# Patient Record
Sex: Female | Born: 1945 | Race: White | Hispanic: No | Marital: Married | Smoking: Former smoker
Health system: Southern US, Community
[De-identification: ages and names within clinical notes are randomized; demographics above are authoritative.]

## PROBLEM LIST (undated history)

## (undated) DIAGNOSIS — Z91038 Other insect allergy status: Secondary | ICD-10-CM

## (undated) DIAGNOSIS — J329 Chronic sinusitis, unspecified: Secondary | ICD-10-CM

## (undated) DIAGNOSIS — I10 Essential (primary) hypertension: Secondary | ICD-10-CM

## (undated) DIAGNOSIS — Z91030 Bee allergy status: Secondary | ICD-10-CM

## (undated) HISTORY — DX: Other insect allergy status: Z91.038

## (undated) HISTORY — PX: POLYPECTOMY: SHX149

## (undated) HISTORY — DX: Bee allergy status: Z91.030

## (undated) HISTORY — PX: COLONOSCOPY: SHX174

## (undated) HISTORY — DX: Essential (primary) hypertension: I10

## (undated) HISTORY — DX: Chronic sinusitis, unspecified: J32.9

---

## 2004-08-04 ENCOUNTER — Ambulatory Visit: Payer: Self-pay | Admitting: Internal Medicine

## 2004-08-26 ENCOUNTER — Ambulatory Visit: Payer: Self-pay | Admitting: Internal Medicine

## 2004-09-15 ENCOUNTER — Ambulatory Visit: Payer: Self-pay | Admitting: Internal Medicine

## 2005-03-08 ENCOUNTER — Ambulatory Visit: Payer: Self-pay | Admitting: Internal Medicine

## 2005-04-06 ENCOUNTER — Ambulatory Visit: Payer: Self-pay | Admitting: Internal Medicine

## 2005-08-02 ENCOUNTER — Ambulatory Visit: Payer: Self-pay | Admitting: Internal Medicine

## 2005-08-03 ENCOUNTER — Ambulatory Visit: Payer: Self-pay | Admitting: Internal Medicine

## 2005-11-01 ENCOUNTER — Ambulatory Visit: Payer: Self-pay | Admitting: Internal Medicine

## 2006-02-17 ENCOUNTER — Ambulatory Visit: Payer: Self-pay | Admitting: Internal Medicine

## 2006-02-21 ENCOUNTER — Ambulatory Visit: Payer: Self-pay | Admitting: Internal Medicine

## 2006-05-02 ENCOUNTER — Ambulatory Visit: Payer: Self-pay | Admitting: Internal Medicine

## 2006-07-24 ENCOUNTER — Ambulatory Visit: Payer: Self-pay | Admitting: Internal Medicine

## 2006-10-31 ENCOUNTER — Ambulatory Visit: Payer: Self-pay | Admitting: Internal Medicine

## 2006-12-15 ENCOUNTER — Ambulatory Visit: Payer: Self-pay | Admitting: Internal Medicine

## 2007-04-28 DIAGNOSIS — J329 Chronic sinusitis, unspecified: Secondary | ICD-10-CM | POA: Insufficient documentation

## 2007-04-28 DIAGNOSIS — J302 Other seasonal allergic rhinitis: Secondary | ICD-10-CM

## 2007-04-28 DIAGNOSIS — J3089 Other allergic rhinitis: Secondary | ICD-10-CM

## 2007-05-01 ENCOUNTER — Ambulatory Visit: Payer: Self-pay | Admitting: Internal Medicine

## 2007-05-02 ENCOUNTER — Ambulatory Visit: Payer: Self-pay | Admitting: Internal Medicine

## 2007-10-17 ENCOUNTER — Encounter: Payer: Self-pay | Admitting: Internal Medicine

## 2007-10-23 ENCOUNTER — Ambulatory Visit: Payer: Self-pay | Admitting: Internal Medicine

## 2007-10-24 ENCOUNTER — Ambulatory Visit: Payer: Self-pay | Admitting: Internal Medicine

## 2007-12-07 ENCOUNTER — Encounter: Payer: Self-pay | Admitting: Internal Medicine

## 2008-02-28 ENCOUNTER — Ambulatory Visit: Payer: Self-pay | Admitting: Internal Medicine

## 2008-04-08 ENCOUNTER — Ambulatory Visit: Payer: Self-pay | Admitting: Internal Medicine

## 2008-04-24 ENCOUNTER — Ambulatory Visit: Payer: Self-pay | Admitting: Internal Medicine

## 2008-05-05 ENCOUNTER — Ambulatory Visit: Payer: Self-pay | Admitting: Cardiology

## 2008-05-22 ENCOUNTER — Ambulatory Visit: Payer: Self-pay | Admitting: Internal Medicine

## 2008-07-21 ENCOUNTER — Ambulatory Visit: Payer: Self-pay | Admitting: Internal Medicine

## 2008-09-19 ENCOUNTER — Ambulatory Visit: Payer: Self-pay | Admitting: Internal Medicine

## 2008-12-01 ENCOUNTER — Ambulatory Visit: Payer: Self-pay | Admitting: Internal Medicine

## 2009-02-03 ENCOUNTER — Telehealth (INDEPENDENT_AMBULATORY_CARE_PROVIDER_SITE_OTHER): Payer: Self-pay | Admitting: *Deleted

## 2009-03-20 ENCOUNTER — Ambulatory Visit: Payer: Self-pay | Admitting: Internal Medicine

## 2009-04-08 ENCOUNTER — Ambulatory Visit: Payer: Self-pay | Admitting: Internal Medicine

## 2009-09-17 ENCOUNTER — Ambulatory Visit: Payer: Self-pay | Admitting: Internal Medicine

## 2009-10-09 ENCOUNTER — Ambulatory Visit: Payer: Self-pay | Admitting: Internal Medicine

## 2010-02-12 ENCOUNTER — Telehealth (INDEPENDENT_AMBULATORY_CARE_PROVIDER_SITE_OTHER): Payer: Self-pay | Admitting: *Deleted

## 2010-03-16 ENCOUNTER — Ambulatory Visit: Payer: Self-pay | Admitting: Internal Medicine

## 2010-03-23 ENCOUNTER — Ambulatory Visit: Payer: Self-pay | Admitting: Internal Medicine

## 2010-05-10 ENCOUNTER — Ambulatory Visit: Payer: Self-pay | Admitting: Internal Medicine

## 2010-08-01 IMAGING — CT CT PARANASAL SINUSES LIMITED
2 of 3 series · 16 of 30 positions shown, 20 images · non-contrast
Comparison: None

CLINICAL DATA: Facial pressure behind eyes.  Congestion.  Cough.

CT PARANASAL SINUS LIMITED WITHOUT CONTRAST

[Series 4: ltd sinus 3.0 h30s · axial · 0.22mm/px · z∈[-110,-82]mm · 3 of 30 slices shown]
[im 3/30  bone]
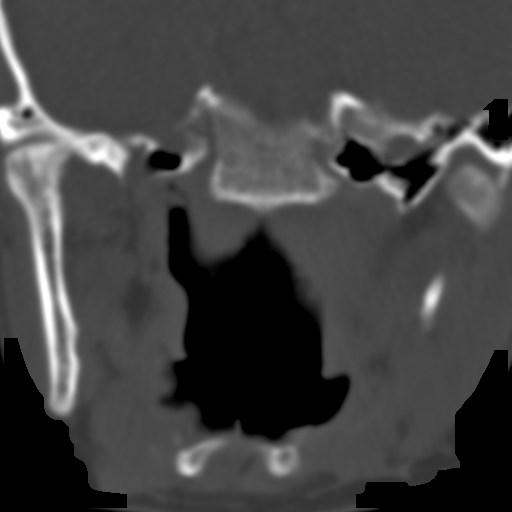
[im 7/30  bone]
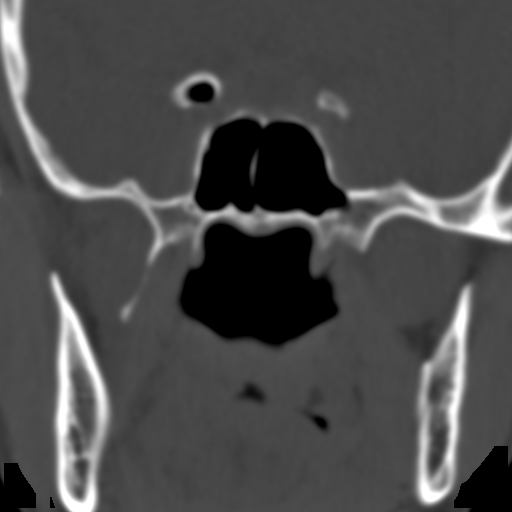
[im 9/30  bone]
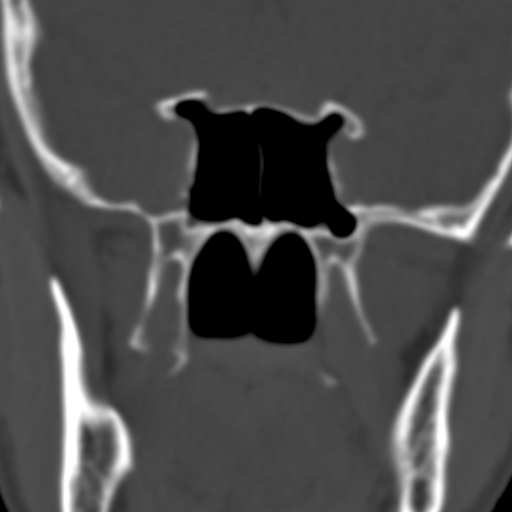

[Series 5: ltd sinus 3.0 h60s · axial · 0.29mm/px · z∈[-87,+25]mm · 13 of 30 slices shown, 17 images]
[im 3/30  brain]
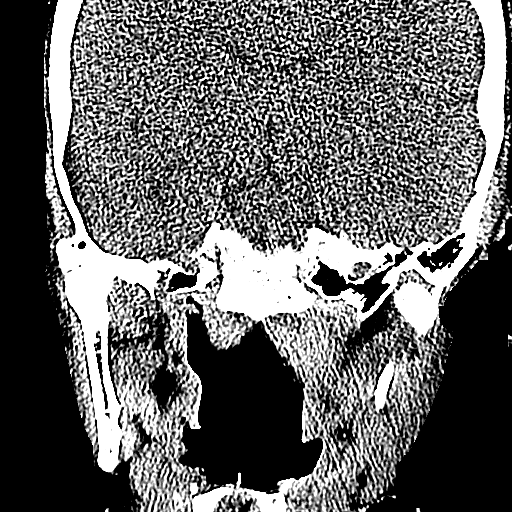
[im 3/30  bone]
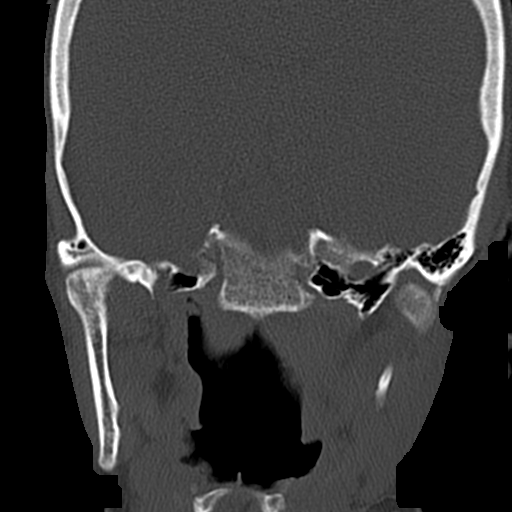
[im 5/30  bone]
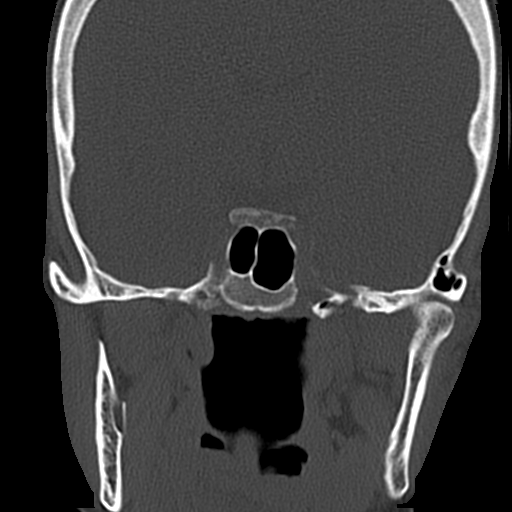
[im 7/30  bone]
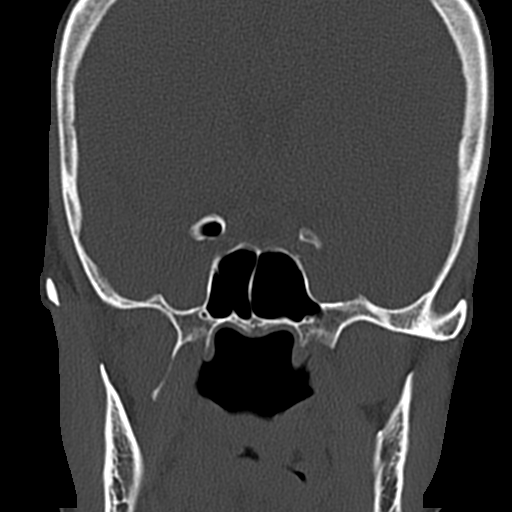
[im 9/30  bone]
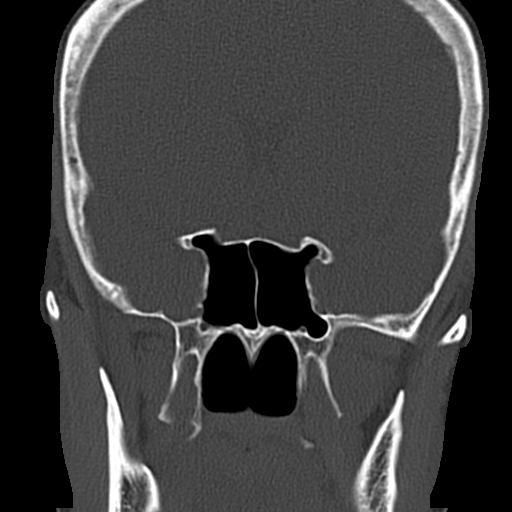
[im 11/30  brain]
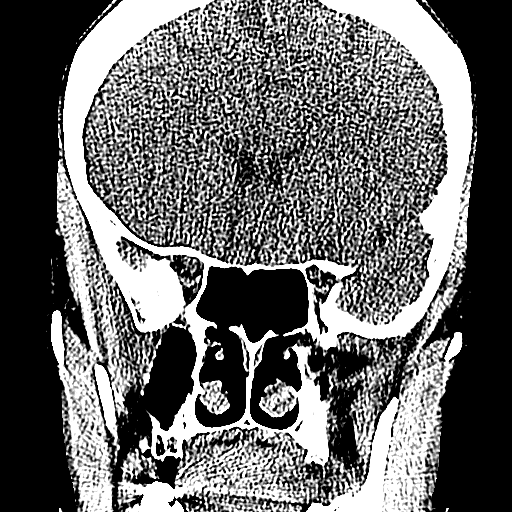
[im 11/30  bone]
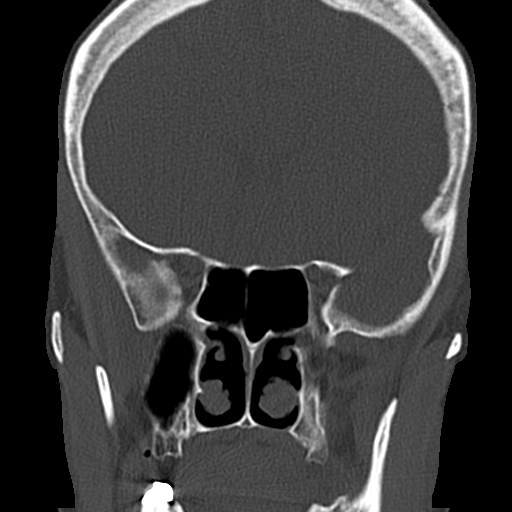
[im 13/30  bone]
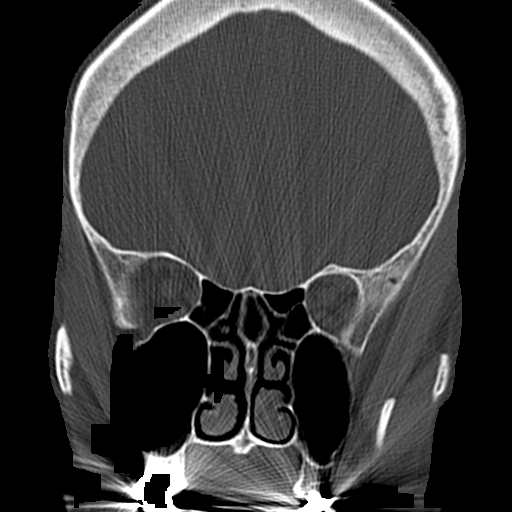
[im 15/30  bone]
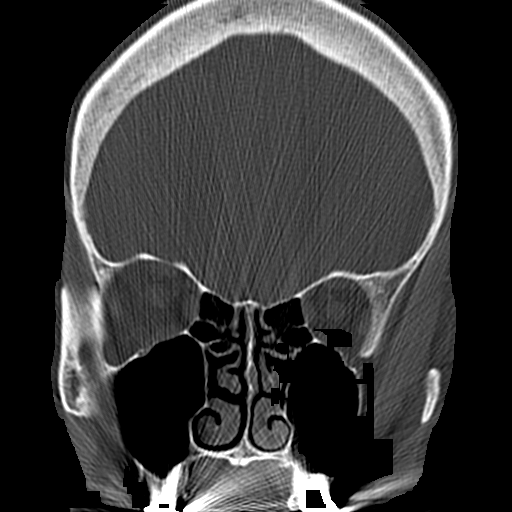
[im 17/30  bone]
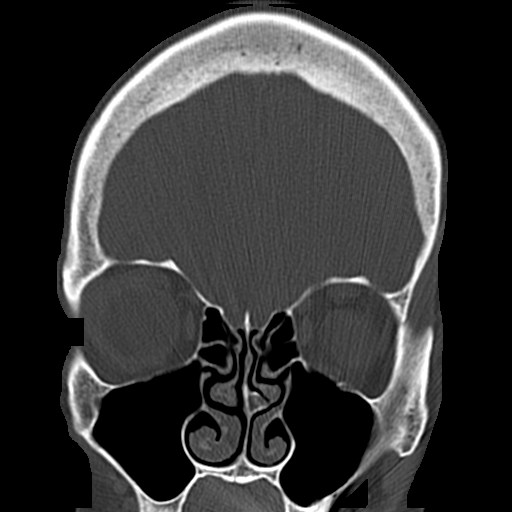
[im 19/30  brain]
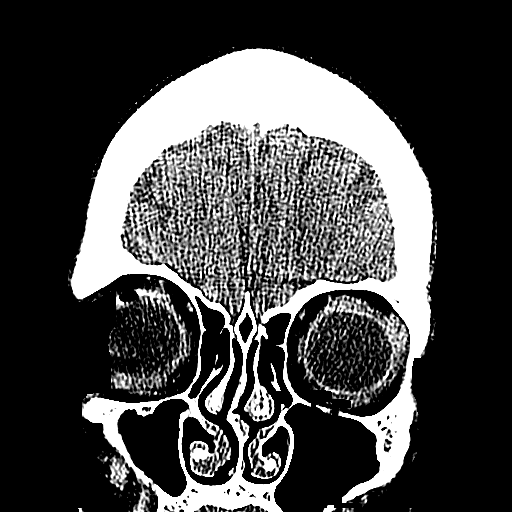
[im 19/30  bone]
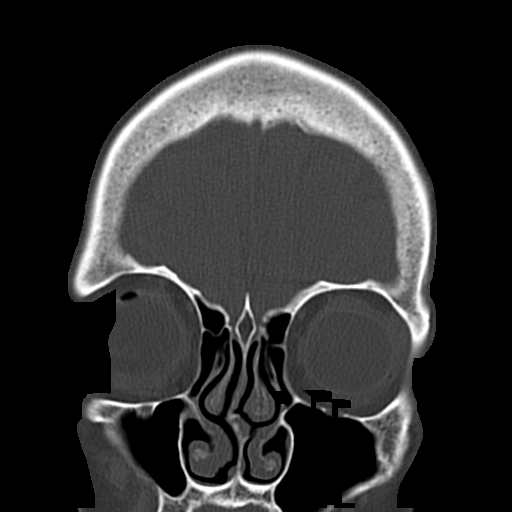
[im 21/30  bone]
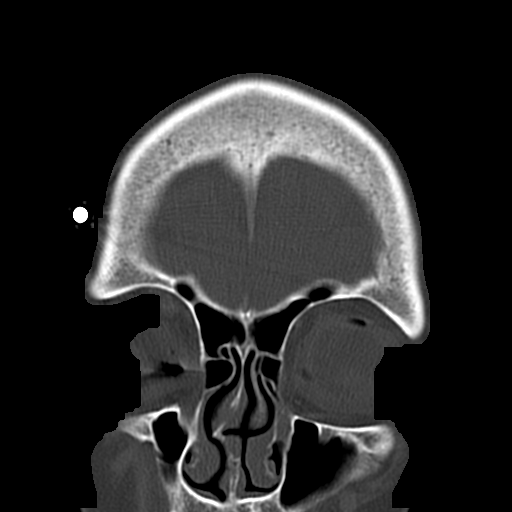
[im 23/30  bone]
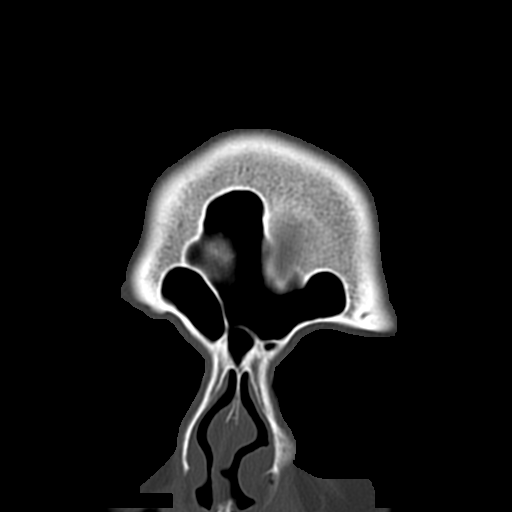
[im 25/30  bone]
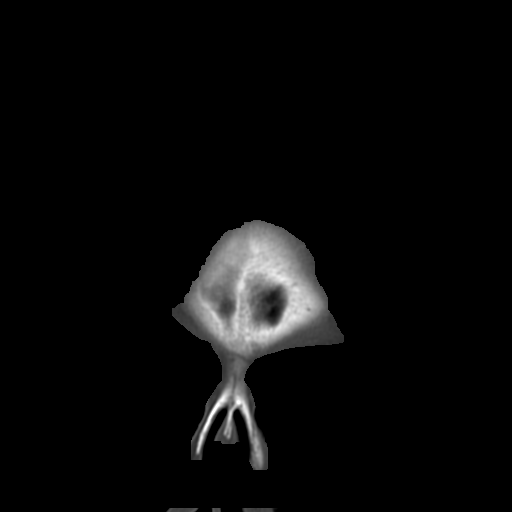
[im 27/30  brain]
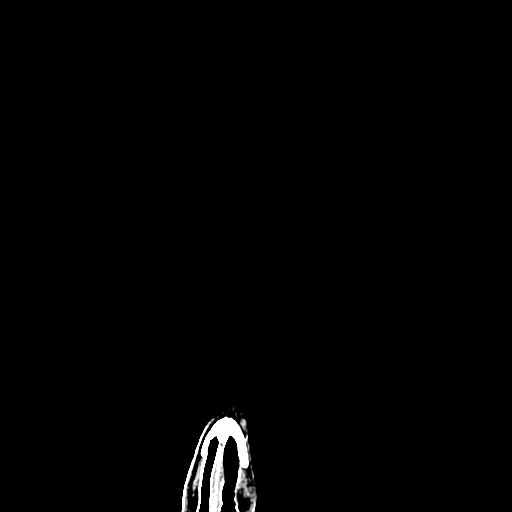
[im 27/30  bone]
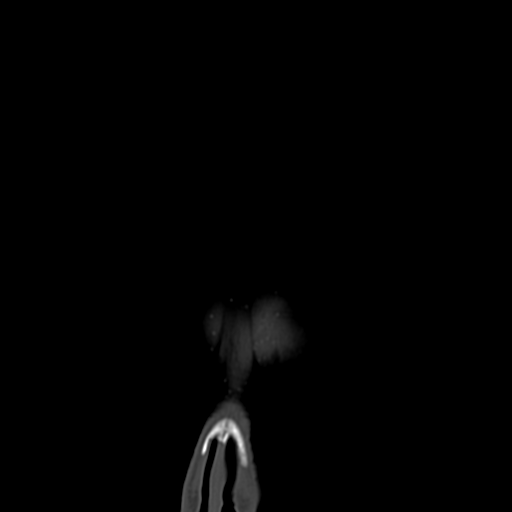

[16 of 30 positions shown; findings below may reference images not displayed]

FINDINGS: Visualized paranasal sinuses are clear.  Rightward nasal
septum deviation.  Visualized intracranial contents show no acute
findings.
IMPRESSION: Clear paranasal sinuses.

## 2010-08-12 NOTE — Assessment & Plan Note (Signed)
Summary: flu shot/ mbw  Nurse Visit   Allergies: No Known Drug Allergies  Orders Added: 1)  Admin 1st Vaccine [90471] 2)  Flu Vaccine 37yrs + [16109] Flu Vaccine Consent Questions     Do you have a history of severe allergic reactions to this vaccine? no    Any prior history of allergic reactions to egg and/or gelatin? no    Do you have a sensitivity to the preservative Thimersol? no    Do you have a past history of Guillan-Barre Syndrome? no    Do you currently have an acute febrile illness? no    Have you ever had a severe reaction to latex? no    Vaccine information given and explained to patient? yes    Are you currently pregnant? no    Lot Number:[id number]   Exp Date:01/08/2011   Site Given  Left Deltoid IM .lbflu1   Tammy Scott  May 13, 2010 4:26 PM

## 2010-08-12 NOTE — Assessment & Plan Note (Signed)
Summary: 6mos f/u...Marland KitchenMarland Kitchenea   Primary Provider/Referring Provider:  Susann Givens  CC:  6 month follow up visit-allergies..  History of Present Illness:  9.10/10 Rhinitis, rhiniosinusits Doing fairly well with little acute illness. Two weeks ago had stomach upset ending with some some pressure in the sinuses. She continued routine meds and took her vitamins- cleared spontaneoulsy. Does well with allergy shots, givingt her own. Takes one or sometimes two of the Allegra-D each day. allergy vaccine saftey and care talk.   September 17, 2009- Rhinitis, rhinosinusiits No acute resiratory illness throughthe winter. Now after putting out mulch she has had nasal congestion, blowing clear mucus, retroorbital pressure. Denies purulent or bloody discharge, earache, fever, chills, cough, wheeze, nausea or heartburn. We discussed her allergy vaccine program which she thinks is working for her. Needs Epipen refill as discussed.  March 16, 2010- Rhinitis, sinusitis Doing well this Fall. Needs Epipen reill. No significant issues with transition to cooler weather. A little cough occasionally- not bothersome.    Preventive Screening-Counseling & Management  Alcohol-Tobacco     Smoking Status: quit     Year Started: 1961     Year Quit: 1976     Pack years: 1PPD X 15 YEARS  Current Medications (verified): 1)  Allergy Injections 1:10 Go W-E .... Per Schedule 2)  Fexofenadine-Pseudoephedrine 60-120 Mg Xr12h-Tab (Fexofenadine-Pseudoephedrine) .Marland Kitchen.. 1 Up To Twice Daily Allergy As Needed 3)  Epipen 0.3 Mg/0.42ml (1:1000)  Devi (Epinephrine Hcl (Anaphylaxis)) .... For Severe Allergic Reaction 4)  Fluticasone Propionate 50 Mcg/act Susp (Fluticasone Propionate) .Marland Kitchen.. 1-2 Sprays Each Nostril Daily  Allergies (verified): No Known Drug Allergies  Past History:  Past Medical History: Last updated: 10/23/2007  SINUSITIS, RECURRENT (ICD-473.9) ALLERGIC RHINITIS (ICD-477.9)  Past Surgical History: Last updated:  09/19/2008 None  Family History: Last updated: 10/23/2007 Father -lung cacer Mother- HTN,CVA  Social History: Last updated: 10/23/2007 Patient states former smoker.   Risk Factors: Smoking Status: quit (03/16/2010)  Social History: Pack years:  1PPD X 15 YEARS  Review of Systems      See HPI       The patient complains of nasal congestion/difficulty breathing through nose.  The patient denies shortness of breath with activity, shortness of breath at rest, productive cough, non-productive cough, coughing up blood, chest pain, irregular heartbeats, acid heartburn, indigestion, loss of appetite, weight change, abdominal pain, difficulty swallowing, sore throat, tooth/dental problems, headaches, and sneezing.    Vital Signs:  Patient profile:   65 year old female Height:      63 inches Weight:      137.13 pounds BMI:     24.38 O2 Sat:      100 % on Room air Pulse rate:   88 / minute BP sitting:   122 / 70  (left arm) Cuff size:   regular  Vitals Entered By: Reynaldo Minium CMA (March 16, 2010 2:27 PM)  O2 Flow:  Room air CC: 6 month follow up visit-allergies.   Physical Exam  Additional Exam:  General: A/Ox3; pleasant and cooperative, NAD, SKIN: no rash, lesions NODES: no lymphadenopathy HEENT: [place]/AT, EOM- WNL, Conjuctivae- clear, PERRLA, TM-WNL, Nose- turbinate edema, some mucus,narrow nose, mild septal deviation to left  Throat- clear and wnl, Malampatti III NECK: Supple w/ fair ROM, JVD- none, normal carotid impulses w/o bruits Thyroid-  CHEST: Clear to P&A HEART: RRR, no m/g/r heard ABDOMEN: FAO:ZHYQ, nl pulses, no edema  NEURO: Grossly intact to observation      Impression & Recommendations:  Problem #  1:  ALLERGIC RHINITIS (ICD-477.9)  She continues alllergy vaccine without concerns. Needs updated Epipen Rx but never needed to use it.  We discussed antihistamine/ decongestants. She asks for alternative to [facility] which she takes every day. Continues  fluticasone. Her updated medication list for this problem includes:    Fluticasone Propionate 50 Mcg/act Susp (Fluticasone propionate) .Marland Kitchen... 1-2 sprays each nostril daily  Problem # 2:  SINUSITIS, RECURRENT (ICD-473.9) We compared symptoms and explained differences. i don't think she has an active sinusitis now.  Medications Added to Medication List This Visit: 1)  Clarinex-d 12 Hour 2.5-120 Mg Xr12h-tab (Desloratadine-pseudoephedrine) .Marland Kitchen.. 1 two times a day as needed  Other Orders: Est. Patient Level III (45409) Est. Patient Level III (81191)  Patient Instructions: 1)  Please schedule a follow-up appointment in 6 months. 2)  continue allergy vaccine 3)  Script refill Epipen sent to your drug store 4)  Script for Clarinex-D to replace Allegra-D Prescriptions: FLUTICASONE PROPIONATE 50 MCG/ACT SUSP (FLUTICASONE PROPIONATE) 1-2 sprays each nostril daily  #3 x 3   Entered by:   Reynaldo Minium CMA   Authorized by:   Waymon Budge MD   Signed by:   Reynaldo Minium CMA on 03/16/2010   Method used:   Print then Give to Patient   RxID:   [id number] EPIPEN 0.3 MG/0.3ML (1:1000)  DEVI (EPINEPHRINE HCL (ANAPHYLAXIS)) For severe allergic reaction  #1 x prn   Entered and Authorized by:   Waymon Budge MD   Signed by:   Waymon Budge MD on 03/16/2010   Method used:   Electronically to        [facility] [phone]* (retail)       [address]       [place], [place]  [address]       Ph: [phone] or [phone]       Fax: [phone]   RxID:   [id number] CLARINEX-D 12 HOUR 2.5-120 MG XR12H-TAB (DESLORATADINE-PSEUDOEPHEDRINE) 1 two times a day as needed  #180 x 3   Entered and Authorized by:   Waymon Budge MD   Signed by:   Waymon Budge MD on 03/16/2010   Method used:   Print then Give to Patient   RxID:   [id number] EPIPEN 0.3 MG/0.3ML (1:1000)  DEVI (EPINEPHRINE HCL (ANAPHYLAXIS)) For severe allergic reaction  #1 x prn   Entered  and Authorized by:   Waymon Budge MD   Signed by:   Waymon Budge MD on 03/16/2010   Method used:   Print then Give to Patient   RxID:   [id number]

## 2010-08-12 NOTE — Progress Notes (Signed)
Summary: rx  Phone Note Call from Patient Call back at Home Phone [phone]   Caller: Patient Call For: young Reason for Call: Refill Medication, Talk to Nurse Summary of Call: generic for Allegra D.  Wants rx sent [facility] Initial call taken by: Eugene Gavia,  February 12, 2010 10:05 AM  Follow-up for Phone Call        Rx was sent by fax to [facility]  Pt aware. Follow-up by: Vernie Murders,  February 12, 2010 10:34 AM    Prescriptions: FEXOFENADINE-PSEUDOEPHEDRINE 60-120 MG XR12H-TAB (FEXOFENADINE-PSEUDOEPHEDRINE) 1 up to twice daily allergy as needed  #180 x 3   Entered by:   Vernie Murders   Authorized by:   Waymon Budge MD   Signed by:   Vernie Murders on 02/12/2010   Method used:   Faxed to ...       [facility] (mail[facility]order)       [address] [place], [place]  [address]       Ph: [phone]       Fax: [phone]   RxID:   [id number]

## 2010-08-12 NOTE — Assessment & Plan Note (Signed)
Summary: rov 6 months///kp   Primary Provider/Referring Provider:  Susann Givens  CC:  6 month follow up visit-needs new RX for epipen.Marland Kitchen  History of Present Illness:  05/22/08- Stable, denies improvement. Head congestion, dry cough, retoorbital pressure headache/ Denies fever, purulent, blood, Continues vaccine at 1:10.aars ok. Has 2 dogs, wood flooring. Quit Nasa- cort and singulair wi/ out help.  09/19/08- Rhinitis, rhinosinusitis Did well after cefdinir, until she caught a persistent cold from her granddaughter lasting about a month- no treatment taken. No longer blowing from head and chest feels well. May be starting to get a little more stuffy as Spring comes in. Continues allergy vaccine.  9.10/10 Rhinitis, rhiniosinusits Doing fairly well with little acute illness. Two weeks ago had stomach upset ending with some some pressure in the sinuses. She continued routine meds and took her vitamins- cleared spontaneoulsy. Does well with allergy shots, givingt her own. Takes one or sometimes two of the Allegra-D each day. allergy vaccine saftey and care talk.   September 17, 2009- Rhinitis, rhinosinusiits No acute resiratory illness throughthe winter. Now after putting out mulch she has had nasal congestion, blowing clear mucus, retroorbital pressure. Denies purulent or bloody discharge, earache, fever, chills, cough, wheeze, nausea or heartburn. We discussed her allergy vaccine program which she thinks is working for her. Needs Epipen refill as discussed.   Current Medications (verified): 1)  Allergy Injections 1:10 Go W-E .... Per Schedule 2)  Fexofenadine-Pseudoephedrine 60-120 Mg Xr12h-Tab (Fexofenadine-Pseudoephedrine) .Marland Kitchen.. 1 Up To Twice Daily Allergy As Needed 3)  Epipen 0.3 Mg/0.26ml (1:1000)  Devi (Epinephrine Hcl (Anaphylaxis)) .... For Sevfere Allergic Reaction 4)  Fluticasone Propionate 50 Mcg/act Susp (Fluticasone Propionate) .Marland Kitchen.. 1-2 Sprays Each Nostril Daily  Allergies (verified): No  Known Drug Allergies  Past History:  Past Medical History: Last updated: 10/23/2007  SINUSITIS, RECURRENT (ICD-473.9) ALLERGIC RHINITIS (ICD-477.9)  Past Surgical History: Last updated: 09/19/2008 None  Family History: Last updated: 10/23/2007 Father -lung cacer Mother- HTN,CVA  Social History: Last updated: 10/23/2007 Patient states former smoker.   Risk Factors: Smoking Status: quit (10/23/2007)  Review of Systems      See HPI  The patient denies anorexia, fever, weight loss, weight gain, vision loss, decreased hearing, hoarseness, chest pain, syncope, dyspnea on exertion, peripheral edema, prolonged cough, headaches, hemoptysis, abdominal pain, and severe indigestion/heartburn.    Vital Signs:  Patient profile:   65 year old female Height:      63 inches Weight:      142.38 pounds BMI:     25.31 O2 Sat:      98 % on Room air Pulse rate:   93 / minute BP sitting:   124 / 82  (left arm) Cuff size:   regular  Vitals Entered By: Reynaldo Minium CMA (September 17, 2009 1:54 PM)  O2 Flow:  Room air  Physical Exam  Additional Exam:  General: A/Ox3; pleasant and cooperative, NAD, SKIN: no rash, lesions NODES: no lymphadenopathy HEENT: [place]/AT, EOM- WNL, Conjuctivae- clear, PERRLA, TM-WNL, Nose- turbinate edema, some mucus,narrow nose Throat- clear and wnl, Melampatti III NECK: Supple w/ fair ROM, JVD- none, normal carotid impulses w/o bruits Thyroid-  CHEST: Clear to P&A HEART: RRR, no m/g/r heard ABDOMEN: ZOX:WRUE, nl pulses, no edema  NEURO: Grossly intact to observation      Impression & Recommendations:  Problem # 1:  ALLERGIC RHINITIS (ICD-477.9)  We think she can resolve this rhinitis without needikng antibiotics. She will continue allergy vaccine. The following medications were removed from the medication list:  Patanase 0.6 % Soln (Olopatadine hcl) .Marland Kitchen... 1-2 sprays each nostril up to twice daily prn Her updated medication list for this problem  includes:    Fluticasone Propionate 50 Mcg/act Susp (Fluticasone propionate) .Marland Kitchen... 1-2 sprays each nostril daily  Orders: Est. Patient Level III (40102)  Problem # 2:  SINUSITIS, RECURRENT (ICD-473.9) We discussed saline nasal rinse and preventative measures.  Medications Added to Medication List This Visit: 1)  Epipen 0.3 Mg/0.86ml (1:1000) Devi (Epinephrine hcl (anaphylaxis)) .... For severe allergic reaction  Patient Instructions: 1)  Please schedule a follow-up appointment in 6 months. 2)  Epipen refilled

## 2010-08-18 DIAGNOSIS — J301 Allergic rhinitis due to pollen: Secondary | ICD-10-CM

## 2010-09-14 ENCOUNTER — Ambulatory Visit (INDEPENDENT_AMBULATORY_CARE_PROVIDER_SITE_OTHER): Payer: PRIVATE HEALTH INSURANCE | Admitting: Internal Medicine

## 2010-09-14 ENCOUNTER — Encounter: Payer: Self-pay | Admitting: Internal Medicine

## 2010-09-14 DIAGNOSIS — J329 Chronic sinusitis, unspecified: Secondary | ICD-10-CM

## 2010-09-14 DIAGNOSIS — J309 Allergic rhinitis, unspecified: Secondary | ICD-10-CM

## 2010-09-21 NOTE — Assessment & Plan Note (Signed)
Summary: 6 month follow up   Primary Provider/Referring Provider:  Susann Givens  CC:  6 month follow up visit-allergies..  History of Present Illness: September 17, 2009- Rhinitis, rhinosinusiits No acute resiratory illness throughthe winter. Now after putting out mulch she has had nasal congestion, blowing clear mucus, retroorbital pressure. Denies purulent or bloody discharge, earache, fever, chills, cough, wheeze, nausea or heartburn. We discussed her allergy vaccine program which she thinks is working for her. Needs Epipen refill as discussed.  March 16, 2010- Rhinitis, sinusitis Doing well this Fall. Needs Epipen refill. No significant issues with transition to cooler weather. A little cough occasionally- not bothersome.  September 14, 2010- Rhinitis, sinusitis Nurse-CC: 6 month follow up visit-allergies. Allergy vac 1:10 GO. We reviewed her toolbox. She thinks these meds will be adequate. Has been using her fluticasone more regularly- 2 to 3x/ week in last 2 weeks as Spring pollens have climbed early.   Preventive Screening-Counseling & Management  Alcohol-Tobacco     Smoking Status: quit     Year Started: 1961     Year Quit: 1976     Pack years: 1PPD X 15 YEARS  Current Medications (verified): 1)  Allergy Injections 1:10 Go W-E .... Per Schedule 2)  Clarinex-D 12 Hour 2.5-120 Mg Xr12h-Tab (Desloratadine-Pseudoephedrine) .Marland Kitchen.. 1 Two Times A Day As Needed 3)  Epipen 0.3 Mg/0.69ml (1:1000)  Devi (Epinephrine Hcl (Anaphylaxis)) .... For Severe Allergic Reaction 4)  Fluticasone Propionate 50 Mcg/act Susp (Fluticasone Propionate) .Marland Kitchen.. 1-2 Sprays Each Nostril Daily  Allergies (verified): No Known Drug Allergies  Past History:  Past Medical History: Last updated: 10/23/2007  SINUSITIS, RECURRENT (ICD-473.9) ALLERGIC RHINITIS (ICD-477.9)  Past Surgical History: Last updated: 09/19/2008 None  Family History: Last updated: 10/23/2007 Father -lung cacer Mother- HTN,CVA  Social  History: Last updated: 10/23/2007 Patient states former smoker.   Risk Factors: Smoking Status: quit (09/14/2010)  Review of Systems      See HPI       The patient complains of nasal congestion/difficulty breathing through nose and sneezing.  The patient denies shortness of breath with activity, shortness of breath at rest, productive cough, non-productive cough, coughing up blood, chest pain, irregular heartbeats, acid heartburn, indigestion, loss of appetite, weight change, abdominal pain, difficulty swallowing, sore throat, tooth/dental problems, and headaches.    Vital Signs:  Patient profile:   65 year old female Height:      63 inches Weight:      143 pounds BMI:     25.42 O2 Sat:      98 % on Room air Pulse rate:   87 / minute BP sitting:   134 / 82  (left arm) Cuff size:   regular  Vitals Entered By: Reynaldo Minium CMA (September 14, 2010 1:39 PM)  O2 Flow:  Room air CC: 6 month follow up visit-allergies.   Physical Exam  Additional Exam:  General: A/Ox3; pleasant and cooperative, NAD, SKIN: no rash, lesions NODES: no lymphadenopathy HEENT: [place]/AT, EOM- WNL, Conjuctivae- clear, PERRLA, TM-WNL, Nose-  less turbinate edema, some mucus,narrow nose, mild septal deviation to left  Throat- clear and wnl, Malampatti III NECK: Supple w/ fair ROM, JVD- none, normal carotid impulses w/o bruits Thyroid-  CHEST: Clear to P&A HEART: RRR, no m/g/r heard ABDOMEN: ZOX:WRUE, nl pulses, no edema  NEURO: Grossly intact to observation      Impression & Recommendations:  Problem # 1:  ALLERGIC RHINITIS (ICD-477.9)  Good control. she will continue present meds. She will continue allergy  vaccine.  Her updated medication list for this problem includes:    Fluticasone Propionate 50 Mcg/act Susp (Fluticasone propionate) .Marland Kitchen... 1-2 sprays each nostril daily  Problem # 2:  SINUSITIS, RECURRENT (ICD-473.9) No recent sinusitis. On exam there is some septal deviation and we discused  indications for septoplasty. Appropriate to pass on this now.   Other Orders: Est. Patient Level III (81191)  Patient Instructions: 1)  Please schedule a follow-up appointment in 1 year. Please call sooner as needed.

## 2010-11-23 NOTE — Assessment & Plan Note (Signed)
[facility]                             PULMONARY OFFICE NOTE   NAME:Alisha, MARILYNE [name]                  MRN:          [id number]  DATE:05/01/2007                            DOB:          May 08, 1946    PULMONARY FOLLOWUP:   PROBLEM:  1. Allergic rhinitis.  2. Recurrent sinusitis.   HISTORY:  Six-month followup.  She had more trouble during the summer  and works outdoors.  In the last month, with cooler weather, she has  been much more comfortable.  She had switched from Allegra 180 to using  Clarinex D.  She and her husband are considering installing hardwood  floors in the home and we discussed this, along with a general  discussion of environmental precautions, air cleaners and casings and  realistic expectations.  She has been doing some saline lavage and, with  that, dropped off most of her other medicines, including Singulair and  Nasacort.  Nothing purulent.  No nose-bleeds.  No chest discomfort,  wheeze or cough.   MEDICATIONS:  1. Allergy vaccine.  2. Sinus Cleanse, which is a saline lavage.   ALLERGIES:  No medication allergy.   OBJECTIVE:  Weight 132 pounds, BP 104/66, pulse 91, room air saturation  99%.  Nasal mucosa looks normal today.  No significant pallor or edema.  No  postnasal drainage or adenopathy.  Breathing is unlabored and quiet.  Heart sounds normal.   IMPRESSION:  Allergic rhinitis.   PLAN:  1. She declines flu vaccine.  2. Environmental information given.  3. Try over-the-counter Nasalcrom spray per directions.  4. May try over-the-counter Claritin or Zyrtec.  5. May try using Singulair off and on two weeks at a time to decide if      it is helpful for her.  6. Schedule return six months, early p.r.n.     Clinton D. Maple Hudson, MD, [facility], FACP  Electronically Signed    CDY/MedQ  DD: 05/01/2007  DT: 05/02/2007  Job #: [id number]   cc:   Sharlot Gowda, M.D.

## 2010-11-26 NOTE — Assessment & Plan Note (Signed)
[facility]                             PULMONARY OFFICE NOTE   NAME:Bulnes, BRYNNLEE CUMPIAN                  MRN:          [id number]  DATE:10/31/2006                            DOB:          07-19-1945    PROBLEMS:  1. Allergic rhinitis.  2. Recurrent sinusitis.   HISTORY:  A 62-month-old followup.  She reports fair winter and spring  with no recent need of antibiotics.  She continues an allergy vaccine  here at 1:10 and thinks that helps her.  A little mucus discharge.  No  chest problems at all.  No headache or epistaxis.   MEDICATIONS:  1. Allegra 180 mg.  2. Allergy vaccine.  3. Nasacort-AQ.  4. Singulair.   No medication allergy.   OBJECTIVE:  VITAL SIGNS:  Weight 136 pounds.  BP 132/64, pulse regular  at 74.  Room air saturation 98%.  HEENT:  Nasal mucosa is pale and edematous.  There is a little mucus  bridging.  No polyps.  The pharynx looks clear.  Conjunctivae are clear.  LUNGS:  Clear.  HEART:  Heart sounds are normal.   IMPRESSION:  Perennial allergic rhinitis.   PLAN:  Samples of Clarinex-D 12 hour to try 1 b.i.d. p.r.n. with  discussion as an alternative to her Allegra 180 mg.  If that is helpful,  she can also try an over-the-counter antihistamine decongestant, as  discussed.  I also suggested she try Breathe Right strips.  She will  continue her vaccine and schedule her return in six months, earlier  p.r.n.     Clinton D. Maple Hudson, MD, [facility], FACP  Electronically Signed    CDY/MedQ  DD: 11/01/2006  DT: 11/02/2006  Job #: [id number]   cc:   Sharlot Gowda, M.D.

## 2010-11-26 NOTE — Assessment & Plan Note (Signed)
 [facility]                               PULMONARY OFFICE NOTE   NAME:[name] [name] CAUDLE                  MRN:          [id number]  DATE:05/02/2006                            DOB:          [birth year]    PROBLEM:  1. Allergic rhinitis.  2. Recurrent sinusitis.   HISTORY:  She says this has been a good summer, but now in the last week she  has had increased congestion in her nose and head. She may actually be  getting better now, but is not clear that this was a cold. She is not  itching or sneezing and there has been no purulent discharge.   MEDICATIONS:  1. She continues allergy vaccine here at 1-10 with no problems.  2. Allegra 180 mg.  3. Nasacort AQ.  4. Singulair.   OBJECTIVE:  Weight: 134 pounds.  Blood pressure: 122/66.  Pulse: Regular,  73.  Room air saturation: 98%.  There is turbinate edema.  Her conjunctivae are clear.  THROAT: Is clear.  LUNGS:  Are clear.  HEART: Heart sounds regular without murmur.   IMPRESSION:  Allergic rhinitis, possible acute viral syndrome.   PLAN:  1. Z-Pak, to hold with discussion.  2. Saline lavage.  3. Flu vaccine, discussed and given.  4. Supportive care and fluids.  5. Return six months, earlier p.r.n.     Clinton D. Maple Hudson, MD, [facility], FACP    CDY/MedQ  DD: 05/03/2006  DT: 05/04/2006  Job #: [id number]   cc:   [name] [name], M.D.

## 2010-12-17 ENCOUNTER — Telehealth: Payer: Self-pay | Admitting: Internal Medicine

## 2010-12-17 NOTE — Telephone Encounter (Signed)
CY, please advise. Thanks!  

## 2010-12-19 NOTE — Telephone Encounter (Signed)
OK to substitute Clarinex-D 24,   # 30, 1 daily if needed. Refill x 1.

## 2010-12-20 NOTE — Telephone Encounter (Signed)
Spoke with Alisha [name], a pharmacist at [facility] and advised okay per CDY for pt to take the 24 hr clarinex-d

## 2011-01-14 ENCOUNTER — Ambulatory Visit (INDEPENDENT_AMBULATORY_CARE_PROVIDER_SITE_OTHER): Payer: PRIVATE HEALTH INSURANCE

## 2011-01-14 DIAGNOSIS — J309 Allergic rhinitis, unspecified: Secondary | ICD-10-CM

## 2011-05-03 ENCOUNTER — Ambulatory Visit (INDEPENDENT_AMBULATORY_CARE_PROVIDER_SITE_OTHER): Payer: PRIVATE HEALTH INSURANCE

## 2011-05-03 DIAGNOSIS — Z23 Encounter for immunization: Secondary | ICD-10-CM

## 2011-05-04 DIAGNOSIS — Z23 Encounter for immunization: Secondary | ICD-10-CM

## 2011-06-20 ENCOUNTER — Telehealth: Payer: Self-pay | Admitting: Internal Medicine

## 2011-06-20 MED ORDER — AZITHROMYCIN 250 MG PO TABS: ORAL_TABLET | ORAL | Status: AC

## 2011-06-20 NOTE — Telephone Encounter (Signed)
Accidentally closed phone note. Pt has KNDA.

## 2011-06-20 NOTE — Telephone Encounter (Signed)
Per CY-try Zpak  #1 take as directed no refills;please update allergy list.

## 2011-06-20 NOTE — Telephone Encounter (Signed)
Addended by: Tommie Sams on: 06/20/2011 10:32 AM   Modules accepted: Orders

## 2011-06-20 NOTE — Telephone Encounter (Signed)
I spoke with pt and she c/o body aches, dry cough, nasal congestion, drainage down back of the throat x Thursday. Pt is not sure if she is running a fever but does not feel like it. Pt states her symptoms change from day to day. Pt has been doing saline spray and her allergy medications. Pt is requesting an apt to be seen today but no available apts with cdy. Please advise Dr. Maple Hudson, thanks  Allergies not on file

## 2011-06-20 NOTE — Telephone Encounter (Signed)
Pt aware of cdy recs and rx has been sent. Nothing further was needed

## 2011-06-22 ENCOUNTER — Ambulatory Visit (INDEPENDENT_AMBULATORY_CARE_PROVIDER_SITE_OTHER): Payer: PRIVATE HEALTH INSURANCE

## 2011-06-22 DIAGNOSIS — J309 Allergic rhinitis, unspecified: Secondary | ICD-10-CM

## 2011-09-09 ENCOUNTER — Encounter: Payer: Self-pay | Admitting: Internal Medicine

## 2011-09-12 ENCOUNTER — Ambulatory Visit (INDEPENDENT_AMBULATORY_CARE_PROVIDER_SITE_OTHER): Payer: PRIVATE HEALTH INSURANCE | Admitting: Internal Medicine

## 2011-09-12 ENCOUNTER — Encounter: Payer: Self-pay | Admitting: Internal Medicine

## 2011-09-12 VITALS — BP 112/66 | HR 89 | Ht 63.0 in | Wt 142.2 lb

## 2011-09-12 DIAGNOSIS — J309 Allergic rhinitis, unspecified: Secondary | ICD-10-CM

## 2011-09-12 MED ORDER — FLUTICASONE PROPIONATE 50 MCG/ACT NA SUSP: 2.0000 | Freq: Every day | NASAL | Status: DC

## 2011-09-12 MED ORDER — EPINEPHRINE 0.3 MG/0.3ML IJ DEVI: 0.3000 mg | Freq: Once | INTRAMUSCULAR | Status: DC

## 2011-09-12 NOTE — Patient Instructions (Signed)
Continue allergy vaccine  Refills for Epipen and Flonase  Good generic otc antihistamines-  Claritin/ loratadine, Allegra/ fexofenadine, Zyrtec/ cetirizine  Good generic otc decongestant - phenylephrine (Sudafed-PE and others)  --can be combined with any antihistamine to be equivalent to Clarinex-D  Please call as needed

## 2011-09-12 NOTE — Progress Notes (Signed)
3/213- 65 yoF followed for sinusitis and allergic rhinitis LOV- 09/14/10 Continues allergy vaccine at 1:10 doing well. Got a dehumidifier which she thinks has been a big help. No significant respiratory infection this winter. We discussed pending spring pollen season.  ROS-see HPI Constitutional:   No-   weight loss, night sweats, fevers, chills, fatigue, lassitude. HEENT:   No-  headaches, difficulty swallowing, tooth/dental problems, sore throat,       No-current or  sneezing, itching, ear ache, nasal congestion, post nasal drip,  CV:  No-   chest pain, orthopnea, PND, swelling in lower extremities, anasarca, dizziness, palpitations Resp: No-   shortness of breath with exertion or at rest.              No-   productive cough,  No non-productive cough,  No- coughing up of blood.              No-   change in color of mucus.  No- wheezing.   Skin: No-   rash or lesions. GI:  No-   heartburn, indigestion, abdominal pain, nausea, GU: No-   dysuria,  MS:  No-   joint pain or swelling.   Neuro-     nothing unusual Psych:  No- change in mood or affect. No depression or anxiety.  No memory loss.  OBJ- Physical Exam General- Alert, Oriented, Affect-appropriate, Distress- none acute Skin- rash-none, lesions- none, excoriation- none Lymphadenopathy- none Head- atraumatic            Eyes- Gross vision intact, PERRLA, conjunctivae and secretions clear            Ears- Hearing, canals-normal            Nose- Clear, no-Septal dev, mucus, polyps, erosion, perforation             Throat- Mallampati II , mucosa clear , drainage- none, tonsils- atrophic Neck- flexible , trachea midline, no stridor , thyroid nl, carotid no bruit Chest - symmetrical excursion , unlabored           Heart/CV- RRR , no murmur , no gallop  , no rub, nl s1 s2                           - JVD- none , edema- none, stasis changes- none, varices- none           Lung- clear to P&A, wheeze- none, cough- none , dullness-none, rub-  none           Chest wall-  Abd-  Br/ Gen/ Rectal- Not done, not indicated Extrem- cyanosis- none, clubbing, none, atrophy- none, strength- nl Neuro- grossly intact to observation

## 2011-09-16 ENCOUNTER — Encounter: Payer: Self-pay | Admitting: Internal Medicine

## 2011-09-16 NOTE — Assessment & Plan Note (Signed)
Good control. Discussed over-the-counter generic antihistamines. Continue allergy vaccine. Refilled EpiPen. Refilled Flonase

## 2011-11-22 ENCOUNTER — Ambulatory Visit (INDEPENDENT_AMBULATORY_CARE_PROVIDER_SITE_OTHER): Payer: PRIVATE HEALTH INSURANCE

## 2011-11-22 DIAGNOSIS — J309 Allergic rhinitis, unspecified: Secondary | ICD-10-CM

## 2012-01-17 ENCOUNTER — Telehealth: Payer: Self-pay | Admitting: Internal Medicine

## 2012-01-17 MED ORDER — AMOXICILLIN-POT CLAVULANATE 500-125 MG PO TABS: 1.0000 | ORAL_TABLET | Freq: Two times a day (BID) | ORAL | Status: DC

## 2012-01-17 NOTE — Telephone Encounter (Signed)
I spoke with pt and she c/o stuffy nose, face feels achy all over, slight nausea x 2 days. Denies any cough, f/c/s/v. She states she can;t blow anything out bc she is so stopped up. She has been taking clarinex -d. She is requesting rx be called in for her. Please advise Dr. Maple Hudson thanks  No Known Allergies

## 2012-01-17 NOTE — Telephone Encounter (Signed)
Per [facility]: augmentin 500 #14 1 bid and mucinex   Pt aware of recs and rx and has been sent

## 2012-04-30 ENCOUNTER — Ambulatory Visit (INDEPENDENT_AMBULATORY_CARE_PROVIDER_SITE_OTHER): Payer: PRIVATE HEALTH INSURANCE

## 2012-04-30 DIAGNOSIS — J309 Allergic rhinitis, unspecified: Secondary | ICD-10-CM

## 2012-05-29 ENCOUNTER — Ambulatory Visit (INDEPENDENT_AMBULATORY_CARE_PROVIDER_SITE_OTHER): Payer: PRIVATE HEALTH INSURANCE

## 2012-05-29 DIAGNOSIS — Z23 Encounter for immunization: Secondary | ICD-10-CM

## 2012-09-13 ENCOUNTER — Ambulatory Visit (INDEPENDENT_AMBULATORY_CARE_PROVIDER_SITE_OTHER): Payer: PRIVATE HEALTH INSURANCE | Admitting: Internal Medicine

## 2012-09-13 ENCOUNTER — Encounter: Payer: Self-pay | Admitting: Internal Medicine

## 2012-09-13 VITALS — BP 124/80 | HR 85 | Ht 63.0 in | Wt 142.4 lb

## 2012-09-13 DIAGNOSIS — J309 Allergic rhinitis, unspecified: Secondary | ICD-10-CM

## 2012-09-13 MED ORDER — AZELASTINE-FLUTICASONE 137-50 MCG/ACT NA SUSP: 1.0000 | Freq: Every day | NASAL | Status: DC

## 2012-09-13 MED ORDER — AZELASTINE-FLUTICASONE 137-50 MCG/ACT NA SUSP: 2.0000 | Freq: Every day | NASAL | Status: DC

## 2012-09-13 NOTE — Progress Notes (Signed)
09/10/11- 65 yoF followed for sinusitis and allergic rhinitis LOV- 09/14/10 Continues allergy vaccine at 1:10 doing well. Got a dehumidifier which she thinks has been a big help. No significant respiratory infection this winter. We discussed pending spring pollen season.  09/13/12- 66 yoF followed for sinusitis and allergic rhinitis FOLLOWS FOR: still on vaccine and doing well; has slight flare ups of feeling like shes is going backwards with treatment. She has been on allergy vaccine 6 or 7 years, currently 1:10 GO and we discussed quitting now. There have been no problems with reactions. With spring pollen season coming, she is going to increase her shot interval to every 2 weeks and see how she does. We also discussed a trial of Dymista.  ROS-see HPI Constitutional:   No-   weight loss, night sweats, fevers, chills, fatigue, lassitude. HEENT:   No-  headaches, difficulty swallowing, tooth/dental problems, sore throat,       No-current or  sneezing, itching, ear ache, +nasal congestion, post nasal drip,  CV:  No-   chest pain, orthopnea, PND, swelling in lower extremities, anasarca, dizziness, palpitations Resp: No-   shortness of breath with exertion or at rest.              No-   productive cough,  No non-productive cough,  No- coughing up of blood.              No-   change in color of mucus.  No- wheezing.   Skin: No-   rash or lesions. GI:  No-   heartburn, indigestion, abdominal pain, nausea, GU: No-   dysuria,  MS:  No-   joint pain or swelling.   Neuro-     nothing unusual Psych:  No- change in mood or affect. No depression or anxiety.  No memory loss.  OBJ- Physical Exam General- Alert, Oriented, Affect-appropriate, Distress- none acute Skin- rash-none, lesions- none, excoriation- none Lymphadenopathy- none Head- atraumatic            Eyes- Gross vision intact, PERRLA, conjunctivae and secretions clear            Ears- Hearing, canals-normal            Nose- Clear, narrow nasal  airway, no-Septal dev, mucus, polyps, erosion, perforation             Throat- Mallampati II , mucosa clear , drainage- none, tonsils- atrophic Neck- flexible , trachea midline, no stridor , thyroid nl, carotid no bruit Chest - symmetrical excursion , unlabored           Heart/CV- RRR , no murmur , no gallop  , no rub, nl s1 s2                           - JVD- none , edema- none, stasis changes- none, varices- none           Lung- clear to P&A, wheeze- none, cough- none , dullness-none, rub- none           Chest wall-  Abd-  Br/ Gen/ Rectal- Not done, not indicated Extrem- cyanosis- none, clubbing, none, atrophy- none, strength- nl Neuro- grossly intact to observation

## 2012-09-13 NOTE — Assessment & Plan Note (Signed)
She can stop allergy vaccine anytime,  discussed with her again. Plan-she is going to start by increasing her allergy vaccine injection interval to every other week as we're heading into the spring pollen season now. Given sample Dymista nasal spray with discussion

## 2012-09-13 NOTE — Patient Instructions (Addendum)
Sample and script Dymista nasal spray       1-2 puffs each nostril once daily at bedtime  Ok to stretch out the interval of your allergy vaccine shots from once per week to every 2 weeks or every 4 weeks if effective. You can always drop back to once per week if needed.

## 2012-10-12 ENCOUNTER — Encounter: Payer: Self-pay | Admitting: Internal Medicine

## 2012-11-05 ENCOUNTER — Ambulatory Visit (AMBULATORY_SURGERY_CENTER): Payer: PRIVATE HEALTH INSURANCE | Admitting: *Deleted

## 2012-11-05 VITALS — Ht 64.0 in | Wt 141.0 lb

## 2012-11-05 DIAGNOSIS — Z1211 Encounter for screening for malignant neoplasm of colon: Secondary | ICD-10-CM

## 2012-11-05 MED ORDER — MOVIPREP 100 G PO SOLR: ORAL | Status: DC

## 2012-11-05 NOTE — Progress Notes (Signed)
emmi information given to patient. 

## 2012-11-12 ENCOUNTER — Ambulatory Visit (INDEPENDENT_AMBULATORY_CARE_PROVIDER_SITE_OTHER): Payer: PRIVATE HEALTH INSURANCE

## 2012-11-12 DIAGNOSIS — J309 Allergic rhinitis, unspecified: Secondary | ICD-10-CM

## 2012-11-15 ENCOUNTER — Telehealth: Payer: Self-pay | Admitting: Internal Medicine

## 2012-11-15 NOTE — Telephone Encounter (Signed)
Spoke with patient regarding her taking  Natural fiber 2 capsules 3 times a day. She  states she does not want to stop the fiber prior to the colonoscopy! Informed pt it was our recommendations to stop fiber for 5 days or she may not get as cleaned out and have to repeat the procedure again.Pt understood, but did not know what she was going to do.Informed pt to drink plenty of liquids and increase her exercise also.She will call back if she has further questions.

## 2012-11-21 ENCOUNTER — Ambulatory Visit (AMBULATORY_SURGERY_CENTER): Payer: PRIVATE HEALTH INSURANCE | Admitting: Internal Medicine

## 2012-11-21 ENCOUNTER — Encounter: Payer: Self-pay | Admitting: Internal Medicine

## 2012-11-21 VITALS — BP 129/71 | HR 73 | Temp 97.7°F | Resp 37 | Ht 64.0 in | Wt 141.0 lb

## 2012-11-21 DIAGNOSIS — D126 Benign neoplasm of colon, unspecified: Secondary | ICD-10-CM

## 2012-11-21 DIAGNOSIS — Z1211 Encounter for screening for malignant neoplasm of colon: Secondary | ICD-10-CM

## 2012-11-21 MED ORDER — SODIUM CHLORIDE 0.9 % IV SOLN: 500.0000 mL | INTRAVENOUS | Status: DC

## 2012-11-21 NOTE — Progress Notes (Signed)
Patient did not experience any of the following events: a burn prior to discharge; a fall within the facility; wrong site/side/patient/procedure/implant event; or a hospital transfer or hospital admission upon discharge from the facility. (G8907) Patient did not have preoperative order for IV antibiotic SSI prophylaxis. (G8918)  

## 2012-11-21 NOTE — Op Note (Signed)
[facility] [address] [place], [address]   COLONOSCOPY PROCEDURE REPORT  PATIENT: Alisha [name], Alisha [name]  MR#: [id number] BIRTHDATE: 07/05/1946 , 66  yrs. old GENDER: Female ENDOSCOPIST: Beverley Fiedler, MD REFERRED XB:JYNWG Hamrick, M.D. PROCEDURE DATE:  11/21/2012 PROCEDURE:   Colonoscopy with cold biopsy polypectomy and Colonoscopy with snare polypectomy ASA CLASS:   Class II INDICATIONS:average risk screening and first colonoscopy. MEDICATIONS: MAC sedation, administered by CRNA and propofol (Diprivan) 500mg  IV  DESCRIPTION OF PROCEDURE:   After the risks benefits and alternatives of the procedure were thoroughly explained, informed consent was obtained.  A digital rectal exam revealed no rectal mass and decreased sphincter tone.   The LB CF-[id number] endoscope was introduced through the anus and advanced to the cecum, which was identified by both the appendix and ileocecal valve. No adverse events experienced.   The quality of the prep was good, using MoviPrep  The instrument was then slowly withdrawn as the colon was fully examined.    COLON FINDINGS: There was severe diverticulosis noted in the ascending colon, transverse colon, descending colon, and sigmoid colon with associated tortuosity and angulation.   A sessile polyp measuring 4 mm in size was found at the cecum.  A polypectomy was performed with cold forceps.  The resection was complete and the polyp tissue was completely retrieved.   A sessile polyp measuring 5 mm in size was found in the transverse colon.  A polypectomy was performed with a cold snare.  The resection was complete and the polyp tissue was completely retrieved.  Retroflexion was not performed due to a narrow rectal vault. The time to cecum=22 minutes 13 seconds.  Withdrawal time=9 minutes 43 seconds.  The scope was withdrawn and the procedure completed. COMPLICATIONS: There were no complications.  ENDOSCOPIC  IMPRESSION: 1.   There was severe diverticulosis noted in the ascending colon, transverse colon, descending colon, and sigmoid colon 2.   Sessile polyp measuring 4 mm in size was found at the cecum; polypectomy was performed with cold forceps 3.   Sessile polyp measuring 5 mm in size was found in the transverse colon; polypectomy was performed with a cold snare  RECOMMENDATIONS: 1.  Await pathology results 2.  High fiber diet 3.  If the polyps removed today are proven to be adenomatous (pre-cancerous) polyps, you will need a repeat colonoscopy in 5 years.  Otherwise you should continue to follow colorectal cancer screening guidelines for "routine risk" patients with colonoscopy in 10 years.  You will receive a letter within 1-2 weeks with the results of your biopsy as well as final recommendations.  Please call my office if you have not received a letter after 3 weeks.  eSigned:  Beverley Fiedler, MD 11/21/2012 9:15 AM      cc: The Patient

## 2012-11-21 NOTE — Progress Notes (Signed)
Called to room to assist during endoscopic procedure.  Patient ID and intended procedure confirmed with present staff. Received instructions for my participation in the procedure from the performing physician.  

## 2012-11-21 NOTE — Patient Instructions (Signed)
YOU HAD AN ENDOSCOPIC PROCEDURE TODAY AT THE [facility]: Refer to the procedure report that was given to you for any specific questions about what was found during the examination.  If the procedure report does not answer your questions, please call your gastroenterologist to clarify.  If you requested that your care partner not be given the details of your procedure findings, then the procedure report has been included in a sealed envelope for you to review at your convenience later.  YOU SHOULD EXPECT: Some feelings of bloating in the abdomen. Passage of more gas than usual.  Walking can help get rid of the air that was put into your GI tract during the procedure and reduce the bloating. If you had a lower endoscopy (such as a colonoscopy or flexible sigmoidoscopy) you may notice spotting of blood in your stool or on the toilet paper. If you underwent a bowel prep for your procedure, then you may not have a normal bowel movement for a few days.  DIET: Your first meal following the procedure should be a light meal and then it is ok to progress to your normal diet.  A half-sandwich or bowl of soup is an example of a good first meal.  Heavy or fried foods are harder to digest and may make you feel nauseous or bloated.  Likewise meals heavy in dairy and vegetables can cause extra gas to form and this can also increase the bloating.  Drink plenty of fluids but you should avoid alcoholic beverages for 24 hours.  ACTIVITY: Your care partner should take you home directly after the procedure.  You should plan to take it easy, moving slowly for the rest of the day.  You can resume normal activity the day after the procedure however you should NOT DRIVE or use heavy machinery for 24 hours (because of the sedation medicines used during the test).    SYMPTOMS TO REPORT IMMEDIATELY: A gastroenterologist can be reached at any hour.  During normal business hours, 8:30 AM to 5:00 PM Monday through Friday,  call [phone].  After hours and on weekends, please call the GI answering service at [phone] who will take a message and have the physician on call contact you.   Following lower endoscopy (colonoscopy or flexible sigmoidoscopy):  Excessive amounts of blood in the stool  Significant tenderness or worsening of abdominal pains  Swelling of the abdomen that is new, acute  Fever of 100F or higher    FOLLOW UP: If any biopsies were taken you will be contacted by phone or by letter within the next 1-3 weeks.  Call your gastroenterologist if you have not heard about the biopsies in 3 weeks.  Our staff will call the home number listed on your records the next business day following your procedure to check on you and address any questions or concerns that you may have at that time regarding the information given to you following your procedure. This is a courtesy call and so if there is no answer at the home number and we have not heard from you through the emergency physician on call, we will assume that you have returned to your regular daily activities without incident.  SIGNATURES/CONFIDENTIALITY: You and/or your care partner have signed paperwork which will be entered into your electronic medical record.  These signatures attest to the fact that that the information above on your After Visit Summary has been reviewed and is understood.  Full responsibility of the confidentiality   of this discharge information lies with you and/or your care-partner.    INFORMATION ON DIVERTICULOSIS,HIGH FIBER DIET,&POLYPS GIVEN TO YOU TODAY

## 2012-11-22 ENCOUNTER — Telehealth: Payer: Self-pay | Admitting: *Deleted

## 2012-11-22 NOTE — Telephone Encounter (Signed)
  Follow up Call-  Call back number 11/21/2012  Post procedure Call Back phone  # [phone]  Permission to leave phone message Yes     Patient questions:  Do you have a fever, pain , or abdominal swelling? no Pain Score  0 *  Have you tolerated food without any problems? yes  Have you been able to return to your normal activities? yes  Do you have any questions about your discharge instructions: Diet   no Medications  no Follow up visit  no  Do you have questions or concerns about your Care? no  Actions: * If pain score is 4 or above: No action needed, pain <4.

## 2012-11-27 ENCOUNTER — Encounter: Payer: Self-pay | Admitting: Internal Medicine

## 2013-01-21 ENCOUNTER — Telehealth: Payer: Self-pay | Admitting: Internal Medicine

## 2013-01-21 MED ORDER — FLUTICASONE PROPIONATE 50 MCG/ACT NA SUSP: 2.0000 | Freq: Every day | NASAL | Status: DC

## 2013-01-21 MED ORDER — AZELASTINE HCL 0.1 % NA SOLN: 2.0000 | Freq: Every day | NASAL | Status: DC

## 2013-01-21 NOTE — Addendum Note (Signed)
Addended by: Orma Flaming D on: 01/21/2013 05:11 PM   Modules accepted: Orders

## 2013-01-21 NOTE — Telephone Encounter (Signed)
I spoke with the pt and she states that the dymista is working well but it is too expensive. She states Dr. Maple Hudson mentioned there was an alternative to this. I mentioned to her that it can be separated into 2 meds and this may be cheaper. Pt is ok with this. Please advise. Alisha [name], CMA No Known Allergies

## 2013-01-21 NOTE — Telephone Encounter (Signed)
Per CY ok to give separate meds fluticasone and astelin. Rx sent. Pt is aware. Carron Curie, CMA

## 2013-01-21 NOTE — Telephone Encounter (Signed)
Rx printed out- resent escribed

## 2013-05-07 ENCOUNTER — Ambulatory Visit (INDEPENDENT_AMBULATORY_CARE_PROVIDER_SITE_OTHER): Payer: PRIVATE HEALTH INSURANCE

## 2013-05-07 DIAGNOSIS — Z23 Encounter for immunization: Secondary | ICD-10-CM

## 2013-09-17 ENCOUNTER — Encounter: Payer: Self-pay | Admitting: Internal Medicine

## 2013-09-17 ENCOUNTER — Ambulatory Visit (INDEPENDENT_AMBULATORY_CARE_PROVIDER_SITE_OTHER): Payer: PRIVATE HEALTH INSURANCE | Admitting: Internal Medicine

## 2013-09-17 VITALS — BP 130/72 | HR 78 | Ht 64.0 in | Wt 142.4 lb

## 2013-09-17 DIAGNOSIS — J309 Allergic rhinitis, unspecified: Secondary | ICD-10-CM

## 2013-09-17 DIAGNOSIS — J3089 Other allergic rhinitis: Principal | ICD-10-CM

## 2013-09-17 DIAGNOSIS — J302 Other seasonal allergic rhinitis: Secondary | ICD-10-CM

## 2013-09-17 NOTE — Progress Notes (Signed)
09/10/11- 65 yoF followed for sinusitis and allergic rhinitis LOV- 09/14/10 Continues allergy vaccine at 1:10 doing well. Got a dehumidifier which she thinks has been a big help. No significant respiratory infection this winter. We discussed pending spring pollen season.  09/13/12- 63 yoF followed for sinusitis and allergic rhinitis FOLLOWS FOR: still on vaccine and doing well; has slight flare ups of feeling like shes is going backwards with treatment. She has been on allergy vaccine 6 or 7 years, currently 1:10 GO and we discussed quitting now. There have been no problems with reactions. With spring pollen season coming, she is going to increase her shot interval to every 2 weeks and see how she does. We also discussed a trial of Dymista.  09/18/13- 27 yoF followed for sinusitis and allergic rhinitis FOLLOWS FOR: Bought humidifier and has improved symptoms. Still on  Allergy vaccine  1:10 GO  twice a month for almost a year and doing well.  Has felt well controlled on allergy vaccine every other week with no problems. Added home humidifier and dehumidifier. We discussed these. No recent sinusitis. Continues Flonase plus Astelin.  ROS-see HPI Constitutional:   No-   weight loss, night sweats, fevers, chills, fatigue, lassitude. HEENT:   No-  headaches, difficulty swallowing, tooth/dental problems, sore throat,       No-current or  sneezing, itching, ear ache, +nasal congestion, post nasal drip,  CV:  No-   chest pain, orthopnea, PND, swelling in lower extremities, anasarca, dizziness, palpitations Resp: No-   shortness of breath with exertion or at rest.              No-   productive cough,  No non-productive cough,  No- coughing up of blood.              No-   change in color of mucus.  No- wheezing.   Skin: No-   rash or lesions. GI:  No-   heartburn, indigestion, abdominal pain, nausea, GU: No-   dysuria,  MS:  No-   joint pain or swelling.   Neuro-     nothing unusual Psych:  No- change in  mood or affect. No depression or anxiety.  No memory loss.  OBJ- Physical Exam General- Alert, Oriented, Affect-appropriate, Distress- none acute Skin- rash-none, lesions- none, excoriation- none Lymphadenopathy- none Head- atraumatic            Eyes- Gross vision intact, PERRLA, conjunctivae and secretions clear            Ears- Hearing, canals-normal            Nose- +mucus bridging, narrow nasal airway, no-Septal dev,  polyps, erosion, perforation             Throat- Mallampati III , mucosa clear , drainage- none, tonsils- atrophic Neck- flexible , trachea midline, no stridor , thyroid nl, carotid no bruit Chest - symmetrical excursion , unlabored           Heart/CV- RRR , no murmur , no gallop  , no rub, nl s1 s2                           - JVD- none , edema- none, stasis changes- none, varices- none           Lung- clear to P&A, wheeze- none, cough- none , dullness-none, rub- none           Chest wall-  Abd-  Br/  Gen/ Rectal- Not done, not indicated Extrem- cyanosis- none, clubbing, none, atrophy- none, strength- nl Neuro- grossly intact to observation

## 2013-09-17 NOTE — Patient Instructions (Signed)
We can continue allergy vaccine 1:10 GO  Ok to continue Flonase/ fluticasone and Astelin/ azelastine nasal sprays as before

## 2013-09-23 ENCOUNTER — Ambulatory Visit (INDEPENDENT_AMBULATORY_CARE_PROVIDER_SITE_OTHER): Payer: PRIVATE HEALTH INSURANCE

## 2013-09-23 DIAGNOSIS — J309 Allergic rhinitis, unspecified: Secondary | ICD-10-CM

## 2013-10-05 NOTE — Assessment & Plan Note (Signed)
Okay to continue allergy vaccine every other week

## 2014-05-06 ENCOUNTER — Ambulatory Visit (INDEPENDENT_AMBULATORY_CARE_PROVIDER_SITE_OTHER): Payer: PRIVATE HEALTH INSURANCE

## 2014-05-06 DIAGNOSIS — Z23 Encounter for immunization: Secondary | ICD-10-CM

## 2014-05-23 ENCOUNTER — Encounter: Payer: Self-pay | Admitting: Internal Medicine

## 2014-08-20 ENCOUNTER — Ambulatory Visit (INDEPENDENT_AMBULATORY_CARE_PROVIDER_SITE_OTHER): Payer: PRIVATE HEALTH INSURANCE

## 2014-08-20 DIAGNOSIS — J309 Allergic rhinitis, unspecified: Secondary | ICD-10-CM

## 2014-08-28 ENCOUNTER — Telehealth: Payer: Self-pay | Admitting: Internal Medicine

## 2014-08-28 NOTE — Telephone Encounter (Signed)
Pt. Called confused she has been on 1:10 for years and received a build-up sheet to start at 0.1. I explained to her it was because we had to put a new extract in her vac. This time because Dust was taken off the market by FDA. After reviewing the process Dr.Young decided to have her start at 0.4 since it is a new vial.

## 2014-09-01 NOTE — Telephone Encounter (Signed)
Pt. Came in Fri.. I gave her Dr. Janee [name] recs she understood and is aware. I also gave her maintenance instructions to replace the 1:10 build-up instructions we gave her.(per Dr. Annamaria Boots)

## 2014-09-18 ENCOUNTER — Ambulatory Visit (INDEPENDENT_AMBULATORY_CARE_PROVIDER_SITE_OTHER): Payer: PRIVATE HEALTH INSURANCE | Admitting: Internal Medicine

## 2014-09-18 ENCOUNTER — Encounter: Payer: Self-pay | Admitting: Internal Medicine

## 2014-09-18 VITALS — BP 124/80 | HR 99 | Ht 64.0 in | Wt 136.0 lb

## 2014-09-18 DIAGNOSIS — J320 Chronic maxillary sinusitis: Secondary | ICD-10-CM

## 2014-09-18 DIAGNOSIS — J3089 Other allergic rhinitis: Principal | ICD-10-CM

## 2014-09-18 DIAGNOSIS — J309 Allergic rhinitis, unspecified: Secondary | ICD-10-CM

## 2014-09-18 DIAGNOSIS — J302 Other seasonal allergic rhinitis: Secondary | ICD-10-CM

## 2014-09-18 MED ORDER — AZITHROMYCIN 250 MG PO TABS: ORAL_TABLET | ORAL | Status: DC

## 2014-09-18 NOTE — Progress Notes (Signed)
09/10/11- 65 yoF followed for sinusitis and allergic rhinitis LOV- 09/14/10 Continues allergy vaccine at 1:10 doing well. Got a dehumidifier which she thinks has been a big help. No significant respiratory infection this winter. We discussed pending spring pollen season.  09/13/12- 9 yoF followed for sinusitis and allergic rhinitis FOLLOWS FOR: still on vaccine and doing well; has slight flare ups of feeling like shes is going backwards with treatment. She has been on allergy vaccine 6 or 7 years, currently 1:10 GO and we discussed quitting now. There have been no problems with reactions. With spring pollen season coming, she is going to increase her shot interval to every 2 weeks and see how she does. We also discussed a trial of Dymista.  09/18/13- 12 yoF followed for sinusitis and allergic rhinitis FOLLOWS FOR: Bought humidifier and has improved symptoms. Still on  Allergy vaccine  1:10 GO  twice a month for almost a year and doing well.  Has felt well controlled on allergy vaccine every other week with no problems. Added home humidifier and dehumidifier. We discussed these. No recent sinusitis. Continues Flonase plus Astelin.  09/18/14- 68  yoF followed for sinusitis and allergic rhinitis Allergy vaccine 1:10 GH ever 2 weeks Follows for. Still on vaccine and doing twice a month and doing well on vaccine. Complains today of nasal congestion and feelings of being lighthead from a seated position. and mild body aches.  She thinks she is catching a cold. We discussed management options and support  ROS-see HPI Constitutional:   No-   weight loss, night sweats, fevers, chills, fatigue, lassitude. HEENT:   No-  headaches, difficulty swallowing, tooth/dental problems, sore throat,       No-current or  sneezing, itching, ear ache, +nasal congestion, post nasal drip,  CV:  No-   chest pain, orthopnea, PND, swelling in lower extremities, anasarca, dizziness, palpitations Resp: No-   shortness of breath with  exertion or at rest.              No-   productive cough,  No non-productive cough,  No- coughing up of blood.              No-   change in color of mucus.  No- wheezing.   Skin: No-   rash or lesions. GI:  No-   heartburn, indigestion, abdominal pain, nausea, GU: No-   dysuria,  MS:  No-   joint pain or swelling.   Neuro-     nothing unusual Psych:  No- change in mood or affect. No depression or anxiety.  No memory loss.  OBJ- Physical Exam General- Alert, Oriented, Affect-appropriate, Distress- none acute Skin- rash-none, lesions- none, excoriation- none Lymphadenopathy- none Head- atraumatic            Eyes- Gross vision intact, PERRLA, conjunctivae and secretions clear            Ears- Hearing, canals-normal            Nose- +mucus bridging, narrow nasal airway, no-Septal dev,  polyps, erosion, perforation             Throat- Mallampati III , mucosa clear , drainage- none, tonsils- atrophic Neck- flexible , trachea midline, no stridor , thyroid nl, carotid no bruit Chest - symmetrical excursion , unlabored           Heart/CV- RRR , no murmur , no gallop  , no rub, nl s1 s2                           -  JVD- none , edema- none, stasis changes- none, varices- none           Lung- clear to P&A, wheeze- none, cough- none , dullness-none, rub- none           Chest wall-  Abd-  Br/ Gen/ Rectal- Not done, not indicated Extrem- cyanosis- none, clubbing, none, atrophy- none, strength- nl Neuro- grossly intact to observation     

## 2014-09-18 NOTE — Patient Instructions (Signed)
Script prointed for Z pak in case you need an antibiotic in the next few days  Neb neo nasal  Depo 80  Call for refills as needed  We can continue allergy vaccine 1:10 GO

## 2014-09-20 NOTE — Assessment & Plan Note (Signed)
She might be on the edge of catching a cold. Because of her history of sinus infections we will print prescription for an antibiotic that she can use if needed, after discussion

## 2014-09-20 NOTE — Assessment & Plan Note (Signed)
Okay to continue allergy vaccine every other week but she understands we can increase back to every week if she needs help with control during pollen season. Antihistamines and nasal steroids reviewed.

## 2014-09-24 ENCOUNTER — Telehealth: Payer: Self-pay | Admitting: Internal Medicine

## 2014-09-24 NOTE — Telephone Encounter (Signed)
Pt is aware of CY's recommendations. Nothing further was needed. 

## 2014-09-24 NOTE — Telephone Encounter (Signed)
Suggest- otc med for motion sickness like dramamine, or could use chlortrimeton (antihistamine) plus sudafed.

## 2014-09-24 NOTE — Telephone Encounter (Signed)
Spoke with pt. Thinks she has an inner ear infection. Reports being lightheaded. Denied any pain or discomfort. Would like CY's recommendations.  No Known Allergies  CY - please advise. Thanks.

## 2015-04-01 ENCOUNTER — Telehealth: Payer: Self-pay | Admitting: Internal Medicine

## 2015-04-01 NOTE — Telephone Encounter (Signed)
Called pt and she wants to call the pharm and see if this is cheaper and she will call back for [place] to call this in if so. Will await call

## 2015-04-01 NOTE — Telephone Encounter (Signed)
Spoke with the pt   She is asking about epinephrine in a vial rather than the pen since the epi-pen is too expensive now  She has heard that this is more affordable  Dr Annamaria Boots, what are your thoughts? Is this something you would prescribe her? Please advise thanks

## 2015-04-01 NOTE — Telephone Encounter (Signed)
Offer Adrenaclick 0.3 mg,  # 1, inject into thigh for severe allergic reaction    Refill prn  Tell her that a pre-measured device is much safer than having to draw up the right amount into a syringe then give self an injection during the stress of an allergic reaction.   She can see if this device would be cheaper for her than Epipen generic.

## 2015-04-02 MED ORDER — EPINEPHRINE 0.3 MG/0.3ML IJ SOAJ
0.3000 mg | Freq: Once | INTRAMUSCULAR | Status: DC
Start: 2015-04-02 — End: 2016-12-27

## 2015-04-02 NOTE — Telephone Encounter (Signed)
Spoke with pt, states she does not wish to change rx and would like a hard script mailed to her home address.  Verified address on file.  rx sent.  Nothing further needed.

## 2015-04-02 NOTE — Telephone Encounter (Signed)
Pt needs a rx for the epi pen and have it mailed to her  [phone]

## 2015-04-13 ENCOUNTER — Encounter: Payer: Self-pay | Admitting: Internal Medicine

## 2015-04-21 ENCOUNTER — Ambulatory Visit (INDEPENDENT_AMBULATORY_CARE_PROVIDER_SITE_OTHER): Payer: PRIVATE HEALTH INSURANCE

## 2015-04-21 DIAGNOSIS — Z23 Encounter for immunization: Secondary | ICD-10-CM | POA: Diagnosis not present

## 2015-05-18 ENCOUNTER — Encounter: Payer: Self-pay | Admitting: Internal Medicine

## 2015-05-20 ENCOUNTER — Telehealth: Payer: Self-pay | Admitting: Internal Medicine

## 2015-05-20 MED ORDER — AMOXICILLIN-POT CLAVULANATE 875-125 MG PO TABS: 1.0000 | ORAL_TABLET | Freq: Two times a day (BID) | ORAL | Status: DC

## 2015-05-20 NOTE — Telephone Encounter (Signed)
Patient would like prescription for sinus infection.  She has been fighting this since 10/18.  She has non-productive cough, runny nose, nasal congestion, body aches.  No fever, some chills.  Ph[facility]: [facility] [place]  No Known Allergies   Current Outpatient Prescriptions on File Prior to Visit  Medication Sig Dispense Refill  . ALOE VERA PO Take 1 tablet by mouth daily.    . Ascorbic Acid (VITAMIN C PO) Take 1 tablet by mouth daily.    Marland Kitchen azelastine (ASTELIN) 137 MCG/SPRAY nasal spray Place 2 sprays into the nose. 2-3 times/week    . azithromycin (ZITHROMAX) 250 MG tablet 2 today then one daily 6 tablet 0  . Cholecalciferol (VITAMIN D PO) Take 1 tablet by mouth daily.    . Coenzyme Q10 (CO Q 10 PO) Take 1 tablet by mouth daily.    Marland Kitchen EPINEPHrine 0.3 mg/0.3 mL IJ SOAJ injection Inject 0.3 mLs (0.3 mg total) into the muscle once. 1 Device 11  . fluticasone (FLONASE) 50 MCG/ACT nasal spray Place 2 sprays into the nose. 2-3 times/week    . Multiple Vitamin (MULTIVITAMIN) tablet Take 1 tablet by mouth daily.    . NONFORMULARY OR COMPOUNDED ITEM Allergy Vaccine 1:10 Given at Home    . Omega-3 Fatty Acids (OMEGA 3 PO) Take 1 capsule by mouth daily.    Marland Kitchen OVER THE COUNTER MEDICATION Take 1 capsule by mouth daily. Bone and body factors    . Probiotic Product (FLORA-Q PO) Take 1 capsule by mouth daily.     No current facility-administered medications on file prior to visit.

## 2015-05-20 NOTE — Telephone Encounter (Signed)
Offer augmentin 875, # 14,  1 twice daily, refill x 1 

## 2015-05-20 NOTE — Telephone Encounter (Signed)
Called and spoke with pt Informed her of CY rec Pt voiced understanding of rec and medication instructions Pharmacy verified with pt Pt instructed if not feeling better within a couple of days to call office back   Order sent electronically to pt's pharmacy  Nothing further is needed

## 2015-07-01 ENCOUNTER — Telehealth: Payer: Self-pay | Admitting: Internal Medicine

## 2015-07-01 NOTE — Telephone Encounter (Signed)
Allergy Serum Extract Date Mixed: 07/01/15 Vial: 1 Strength: 1:10 Here/Mail/Pick Up: mail Mixed By: tbs Last OV: 09/18/14 Pending OV: 09/18/15

## 2015-07-02 DIAGNOSIS — J309 Allergic rhinitis, unspecified: Secondary | ICD-10-CM | POA: Diagnosis not present

## 2015-09-18 ENCOUNTER — Ambulatory Visit (INDEPENDENT_AMBULATORY_CARE_PROVIDER_SITE_OTHER): Payer: PRIVATE HEALTH INSURANCE | Admitting: Internal Medicine

## 2015-09-18 ENCOUNTER — Encounter: Payer: Self-pay | Admitting: Internal Medicine

## 2015-09-18 VITALS — BP 124/66 | HR 73 | Ht 64.0 in | Wt 138.4 lb

## 2015-09-18 DIAGNOSIS — J302 Other seasonal allergic rhinitis: Secondary | ICD-10-CM

## 2015-09-18 DIAGNOSIS — J309 Allergic rhinitis, unspecified: Secondary | ICD-10-CM | POA: Diagnosis not present

## 2015-09-18 DIAGNOSIS — J3089 Other allergic rhinitis: Principal | ICD-10-CM

## 2015-09-18 MED ORDER — "TUBERCULIN-ALLERGY SYRINGES 27G X 1/2"" 0.5 ML MISC": Status: DC

## 2015-09-18 NOTE — Assessment & Plan Note (Signed)
We discussed quitting vaccine. Final decision was to continue through this year then we will help her either stop or transition to another allergist as appropriate as my retirement plans clarified.

## 2015-09-18 NOTE — Patient Instructions (Signed)
We can continue allergy vaccine another year   We will send script for allergy vaccine syringes to your drug store

## 2015-09-18 NOTE — Progress Notes (Signed)
09/10/11- 65 yoF followed for sinusitis and allergic rhinitis LOV- 09/14/10 Continues allergy vaccine at 1:10 doing well. Got a dehumidifier which she thinks has been a big help. No significant respiratory infection this winter. We discussed pending spring pollen season.  09/13/12- 78 yoF followed for sinusitis and allergic rhinitis FOLLOWS FOR: still on vaccine and doing well; has slight flare ups of feeling like shes is going backwards with treatment. She has been on allergy vaccine 6 or 7 years, currently 1:10 GO and we discussed quitting now. There have been no problems with reactions. With spring pollen season coming, she is going to increase her shot interval to every 2 weeks and see how she does. We also discussed a trial of Dymista.  09/18/13- 62 yoF followed for sinusitis and allergic rhinitis FOLLOWS FOR: Bought humidifier and has improved symptoms. Still on  Allergy vaccine  1:10 GO  twice a month for almost a year and doing well.  Has felt well controlled on allergy vaccine every other week with no problems. Added home humidifier and dehumidifier. We discussed these. No recent sinusitis. Continues Flonase plus Astelin.  09/18/14- 68  yoF followed for sinusitis and allergic rhinitis Allergy vaccine 1:10 GO ever 2 weeks Follows for. Still on vaccine and doing twice a month and doing well on vaccine. Complains today of nasal congestion and feelings of being lighthead from a seated position. and mild body aches.  She thinks she is catching a cold. We discussed management options and support  09/18/2015-70 year old female former smoker followed for allergic rhinitis, chronic recurrent sinusitis Allergy vaccine 1:10 G0 We discussed my schedule slow down. She feels she can tell when allergy vaccine is due. No asthma. Using artificial tears more now for dry eyes. FOLLOWS FOR: still on allergy vaccine-injections every other week.  ROS-see HPI Constitutional:   No-   weight loss, night sweats,  fevers, chills, fatigue, lassitude. HEENT:   No-  headaches, difficulty swallowing, tooth/dental problems, sore throat,       No-current or  sneezing, itching, ear ache, +nasal congestion, post nasal drip,  CV:  No-   chest pain, orthopnea, PND, swelling in lower extremities, anasarca, dizziness, palpitations Resp: No-   shortness of breath with exertion or at rest.              No-   productive cough,  No non-productive cough,  No- coughing up of blood.              No-   change in color of mucus.  No- wheezing.   Skin: No-   rash or lesions. GI:  No-   heartburn, indigestion, abdominal pain, nausea, GU: No-   dysuria,  MS:  No-   joint pain or swelling.   Neuro-     nothing unusual Psych:  No- change in mood or affect. No depression or anxiety.  No memory loss.  OBJ- Physical Exam General- Alert, Oriented, Affect-appropriate, Distress- none acute Skin- rash-none, lesions- none, excoriation- none Lymphadenopathy- none Head- atraumatic            Eyes- Gross vision intact, PERRLA, conjunctivae and secretions clear            Ears- Hearing, canals-normal            Nose- +mucus bridging, narrow nasal airway, + external dev,  polyps, erosion, perforation             Throat- Mallampati III , mucosa clear , drainage- none, tonsils- atrophic Neck- flexible ,  trachea midline, no stridor , thyroid nl, carotid no bruit Chest - symmetrical excursion , unlabored           Heart/CV- RRR , no murmur , no gallop  , no rub, nl s1 s2                           - JVD- none , edema- none, stasis changes- none, varices- none           Lung- clear to P&A, wheeze- none, cough- none , dullness-none, rub- none           Chest wall-  Abd-  Br/ Gen/ Rectal- Not done, not indicated Extrem- cyanosis- none, clubbing, none, atrophy- none, strength- nl Neuro- grossly intact to observation

## 2015-09-30 ENCOUNTER — Telehealth: Payer: Self-pay

## 2015-09-30 NOTE — Telephone Encounter (Signed)
Called pt in error. Advised not due for colonoscopy until May 2019.

## 2016-03-21 ENCOUNTER — Ambulatory Visit (INDEPENDENT_AMBULATORY_CARE_PROVIDER_SITE_OTHER): Payer: PRIVATE HEALTH INSURANCE | Admitting: Internal Medicine

## 2016-03-21 ENCOUNTER — Encounter: Payer: Self-pay | Admitting: Internal Medicine

## 2016-03-21 DIAGNOSIS — J302 Other seasonal allergic rhinitis: Secondary | ICD-10-CM

## 2016-03-21 DIAGNOSIS — J3089 Other allergic rhinitis: Secondary | ICD-10-CM

## 2016-03-21 DIAGNOSIS — J309 Allergic rhinitis, unspecified: Secondary | ICD-10-CM

## 2016-03-21 DIAGNOSIS — Z23 Encounter for immunization: Secondary | ICD-10-CM | POA: Diagnosis not present

## 2016-03-21 NOTE — Progress Notes (Signed)
09/10/11- 65 yoF followed for sinusitis and allergic rhinitis LOV- 09/14/10 Continues allergy vaccine at 1:10 doing well. Got a dehumidifier which she thinks has been a big help. No significant respiratory infection this winter. We discussed pending spring pollen season.  09/13/12- 16 yoF followed for sinusitis and allergic rhinitis FOLLOWS FOR: still on vaccine and doing well; has slight flare ups of feeling like shes is going backwards with treatment. She has been on allergy vaccine 6 or 7 years, currently 1:10 GO and we discussed quitting now. There have been no problems with reactions. With spring pollen season coming, she is going to increase her shot interval to every 2 weeks and see how she does. We also discussed a trial of Dymista.  09/18/13- 88 yoF followed for sinusitis and allergic rhinitis FOLLOWS FOR: Bought humidifier and has improved symptoms. Still on  Allergy vaccine  1:10 GO  twice a month for almost a year and doing well.  Has felt well controlled on allergy vaccine every other week with no problems. Added home humidifier and dehumidifier. We discussed these. No recent sinusitis. Continues Flonase plus Astelin.  09/18/14- 68  yoF followed for sinusitis and allergic rhinitis Allergy vaccine 1:10 GO ever 2 weeks Follows for. Still on vaccine and doing twice a month and doing well on vaccine. Complains today of nasal congestion and feelings of being lighthead from a seated position. and mild body aches.  She thinks she is catching a cold. We discussed management options and support  09/18/2015-70 year old female former smoker followed for allergic rhinitis, chronic recurrent sinusitis Allergy vaccine 1:10 G0 We discussed my schedule slow down. She feels she can tell when allergy vaccine is due. No asthma. Using artificial tears more now for dry eyes. FOLLOWS FOR: still on allergy vaccine-injections every other week.  03/21/2016- 70 year old female former smoker followed for allergic  rhinitis, chronic recurrent sinusitis Allergy vaccine 1:10 G0 FOLLOW FOR:  allergies; no concerns, just wants to discuss next step. She's been on allergy vaccine at least 12 years. We discussed our plans to close the allergy clinic here this winter. I suggested she finish allergy vaccine and then stop to see what happens. Mild problems could be managed with OTC nasal spray and antihistamine. She has a perennial nasal stuffiness which may justify ENT evaluation. If allergy problems are difficult enough, we discussed available allergy practices in the community for her.  ROS-see HPI Constitutional:   No-   weight loss, night sweats, fevers, chills, fatigue, lassitude. HEENT:   No-  headaches, difficulty swallowing, tooth/dental problems, sore throat,       No-current or  sneezing, itching, ear ache, +nasal congestion, post nasal drip,  CV:  No-   chest pain, orthopnea, PND, swelling in lower extremities, anasarca, dizziness, palpitations Resp: No-   shortness of breath with exertion or at rest.              No-   productive cough,  No non-productive cough,  No- coughing up of blood.              No-   change in color of mucus.  No- wheezing.   Skin: No-   rash or lesions. GI:  No-   heartburn, indigestion, abdominal pain, nausea, GU: No-   dysuria,  MS:  No-   joint pain or swelling.   Neuro-     nothing unusual Psych:  No- change in mood or affect. No depression or anxiety.  No memory loss.  OBJ- Physical  Exam General- Alert, Oriented, Affect-appropriate, Distress- none acute Skin- rash-none, lesions- none, excoriation- none Lymphadenopathy- none Head- atraumatic            Eyes- Gross vision intact, PERRLA, conjunctivae and secretions clear            Ears- Hearing, canals-normal            Nose- +mucus bridging, narrow nasal airway, + external dev,  polyps, erosion, perforation             Throat- Mallampati III , mucosa clear , drainage- none, tonsils- atrophic Neck- flexible , trachea  midline, no stridor , thyroid nl, carotid no bruit Chest - symmetrical excursion , unlabored           Heart/CV- RRR , no murmur , no gallop  , no rub, nl s1 s2                           - JVD- none , edema- none, stasis changes- none, varices- none           Lung- clear to P&A, wheeze- none, cough- none , dullness-none, rub- none           Chest wall-  Abd-  Br/ Gen/ Rectal- Not done, not indicated Extrem- cyanosis- none, clubbing, none, atrophy- none, strength- nl Neuro- grossly intact to observation

## 2016-03-21 NOTE — Assessment & Plan Note (Signed)
She will decide when to stop allergy vaccine up until we close the allergy clinic this winter. If symptoms require, she can transfer to another allergy practice. There is always been a component of mechanical obstruction which could be evaluated by ENT.

## 2016-03-21 NOTE — Patient Instructions (Addendum)
Flu vax  We agreedd you would fill allergy vaccine this last time. When it runs out, then an option will be to stop shots and wait to see how you do. You can use flonase and/ or an antihistamine as needed. You can consider asking an ENT to see if there is mechanical blockage in your nose that could be helped.  If needed, you could go to another allergy practice.

## 2016-03-28 DIAGNOSIS — J309 Allergic rhinitis, unspecified: Secondary | ICD-10-CM | POA: Diagnosis not present

## 2016-03-31 ENCOUNTER — Telehealth: Payer: Self-pay | Admitting: Internal Medicine

## 2016-03-31 NOTE — Telephone Encounter (Signed)
Allergy Serum Extract Date Mixed: 03/31/16 Vial: 1 Strength: 1:10 Here/Mail/Pick Up: mail Mixed By: tbs Last OV: 09/18/15 Pending OV: 03/21/16

## 2016-05-02 ENCOUNTER — Telehealth: Payer: Self-pay | Admitting: Internal Medicine

## 2016-05-02 MED ORDER — DOXYCYCLINE HYCLATE 100 MG PO TABS
ORAL_TABLET | ORAL | 0 refills | Status: DC
Start: 2016-05-02 — End: 2016-08-02

## 2016-05-02 NOTE — Telephone Encounter (Signed)
Spoke with pt and advised of Dr Young's recommendations.  Rx sent to pharmacy. 

## 2016-05-02 NOTE — Telephone Encounter (Signed)
Offer doxycycline 100 mg, # 8, 2 today then one daily 

## 2016-05-02 NOTE — Telephone Encounter (Signed)
Pt c/o nasal stuffiness, body aches, pain in right ear.  Denies fever, sinus drainage or sorethroat. Pt states she feels like she is getting a sinus infection.  Has started using Nasalcrom.  Please advise.  No Known Allergies  Current Outpatient Prescriptions on File Prior to Visit  Medication Sig Dispense Refill  . ALOE VERA PO Take 1 tablet by mouth daily.    . Ascorbic Acid (VITAMIN C PO) Take 1 tablet by mouth daily.    . Cholecalciferol (VITAMIN D PO) Take 1 tablet by mouth daily.    . Coenzyme Q10 (CO Q 10 PO) Take 1 tablet by mouth daily.    Marland Kitchen EPINEPHrine 0.3 mg/0.3 mL IJ SOAJ injection Inject 0.3 mLs (0.3 mg total) into the muscle once. 1 Device 11  . Multiple Vitamin (MULTIVITAMIN) tablet Take 1 tablet by mouth daily.    . NONFORMULARY OR COMPOUNDED ITEM Allergy Vaccine 1:10 Given at Home    . Omega-3 Fatty Acids (OMEGA 3 PO) Take 1 capsule by mouth daily.    Marland Kitchen OVER THE COUNTER MEDICATION Take 1 capsule by mouth daily. Bone and body factors    . Probiotic Product (FLORA-Q PO) Take 1 capsule by mouth daily.    . Tuberculin-Allergy Syringes (B-D TB SYRINGE .5CC/27GX1/2") 27G X 1/2" 0.5 ML MISC Use to administer allergy vaccine as directed. 100 each 11   No current facility-administered medications on file prior to visit.

## 2016-05-12 ENCOUNTER — Telehealth: Payer: Self-pay | Admitting: Internal Medicine

## 2016-05-12 MED ORDER — AMOXICILLIN-POT CLAVULANATE 875-125 MG PO TABS: 1.0000 | ORAL_TABLET | Freq: Two times a day (BID) | ORAL | 0 refills | Status: DC

## 2016-05-12 NOTE — Telephone Encounter (Signed)
Pt finished doxycycline for sinus infection- not feeling better.  Pt still has sinus congestion, PND, chills, body aches, nausea.  States the abx helped for 1-2 days but after completing course she began feeling worse again.  Denies fever, chest pain. Requesting further recs.   [facility] [place]   Last ov: 03/21/16 Next ov: none, no recall in chart.  CY please advise.  Thanks!

## 2016-05-12 NOTE — Telephone Encounter (Signed)
Offer augmentin 875 mg, # 28, 1 twice daily x 2 weeks

## 2016-05-12 NOTE — Telephone Encounter (Signed)
Spoke with pt. She is aware of CY's recommendation. Rx has been sent in. Nothing further was needed.  

## 2016-05-27 ENCOUNTER — Telehealth: Payer: Self-pay | Admitting: Internal Medicine

## 2016-05-27 MED ORDER — LEVOFLOXACIN 500 MG PO TABS: 500.0000 mg | ORAL_TABLET | Freq: Every day | ORAL | 0 refills | Status: DC

## 2016-05-27 NOTE — Telephone Encounter (Signed)
Called and spoke with pt and she is aware of CY recs.  Nothing further is needed.  

## 2016-05-27 NOTE — Telephone Encounter (Signed)
Pt was given her second round of abx on 05/12/16(augumentin 875mg ), pt finished abx on 05/25/16. Pt has noticed slight improvement. Pt c/o nasal congestion, sore throat & body aches. Pt is taking nasalcrom tid daily with no improvement.  CY please advise. Thanks.   Current Outpatient Prescriptions on File Prior to Visit  Medication Sig Dispense Refill  . ALOE VERA PO Take 1 tablet by mouth daily.    Marland Kitchen amoxicillin-clavulanate (AUGMENTIN) 875-125 MG tablet Take 1 tablet by mouth 2 (two) times daily. 28 tablet 0  . Ascorbic Acid (VITAMIN C PO) Take 1 tablet by mouth daily.    . Cholecalciferol (VITAMIN D PO) Take 1 tablet by mouth daily.    . Coenzyme Q10 (CO Q 10 PO) Take 1 tablet by mouth daily.    Marland Kitchen doxycycline (VIBRA-TABS) 100 MG tablet Take 2 tablets today then 1 tablet daily until gone 8 tablet 0  . EPINEPHrine 0.3 mg/0.3 mL IJ SOAJ injection Inject 0.3 mLs (0.3 mg total) into the muscle once. 1 Device 11  . Multiple Vitamin (MULTIVITAMIN) tablet Take 1 tablet by mouth daily.    . NONFORMULARY OR COMPOUNDED ITEM Allergy Vaccine 1:10 Given at Home    . Omega-3 Fatty Acids (OMEGA 3 PO) Take 1 capsule by mouth daily.    Marland Kitchen OVER THE COUNTER MEDICATION Take 1 capsule by mouth daily. Bone and body factors    . Probiotic Product (FLORA-Q PO) Take 1 capsule by mouth daily.    . Tuberculin-Allergy Syringes (B-D TB SYRINGE .5CC/27GX1/2") 27G X 1/2" 0.5 ML MISC Use to administer allergy vaccine as directed. 100 each 11   No current facility-administered medications on file prior to visit.    No Known Allergies

## 2016-05-27 NOTE — Telephone Encounter (Signed)
Offer levaquin 500 mg, # 10, 1 daily

## 2016-06-23 ENCOUNTER — Telehealth: Payer: Self-pay | Admitting: *Deleted

## 2016-06-23 NOTE — Telephone Encounter (Signed)
Pt. Sent in an allergy order.Called pt. And reminded her of CY's recs of her last ov.  Lmomtcb if she has any questions. Will leave encounter open for a few days.

## 2016-06-28 NOTE — Telephone Encounter (Signed)
Called pt. Back to see if she got my previous message. I got the machine again. Asked pt. Tcb. Will wait for pt. To call.

## 2016-06-29 ENCOUNTER — Telehealth: Payer: Self-pay | Admitting: Internal Medicine

## 2016-06-29 NOTE — Telephone Encounter (Signed)
Pt. Called back to get the name of an ENT that is affiliated with our [place] office. I looked it's not in your ov notes. I transferred pt. Up front to leave a message with you so you could hopefully give her that information. I'll route this to you in case you don't get the pt.'s message.

## 2016-06-29 NOTE — Telephone Encounter (Signed)
Patient returned phone call: contact # [phone].Marland KitchenMearl [name]

## 2016-06-29 NOTE — Telephone Encounter (Signed)
Spoke with patient, she would like to have a recommendation of an ENT to see. I asked if she had a preferred city, she said no. Please advise. Thanks!

## 2016-06-30 NOTE — Telephone Encounter (Signed)
Spoke with pt and informed her of CY recc. She stated she would check them out. Nothing further is needed at this time.  Alisha Lever, MD  [facility] Triage Pool 10 minutes ago (9:02 AM)    Suggest [facility] ( Dr Bruna Potter group) in [place]

## 2016-06-30 NOTE — Telephone Encounter (Signed)
Suggest [facility] ( Dr Kozlow's group) in [place]

## 2016-07-01 NOTE — Telephone Encounter (Signed)
CY please advise of an ENT that you would rec for this pt.  thanks

## 2016-07-06 NOTE — Telephone Encounter (Signed)
Suggest [facility]- [place]

## 2016-07-06 NOTE — Telephone Encounter (Signed)
Called and spoke with pt and she is aware of CY recs.  Nothing further is needed.  

## 2016-08-02 ENCOUNTER — Encounter (INDEPENDENT_AMBULATORY_CARE_PROVIDER_SITE_OTHER): Payer: Self-pay

## 2016-08-02 ENCOUNTER — Ambulatory Visit (INDEPENDENT_AMBULATORY_CARE_PROVIDER_SITE_OTHER): Payer: Managed Care, Other (non HMO) | Admitting: Allergy and Immunology

## 2016-08-02 ENCOUNTER — Encounter: Payer: Self-pay | Admitting: Allergy and Immunology

## 2016-08-02 VITALS — BP 140/82 | HR 80 | Temp 98.6°F | Resp 16 | Ht 63.0 in | Wt 136.8 lb

## 2016-08-02 DIAGNOSIS — Z91030 Bee allergy status: Secondary | ICD-10-CM

## 2016-08-02 DIAGNOSIS — Z91038 Other insect allergy status: Secondary | ICD-10-CM | POA: Diagnosis not present

## 2016-08-02 DIAGNOSIS — J3089 Other allergic rhinitis: Secondary | ICD-10-CM | POA: Diagnosis not present

## 2016-08-02 MED ORDER — EPINEPHRINE 0.3 MG/0.3ML IJ SOAJ: INTRAMUSCULAR | 3 refills | Status: DC

## 2016-08-02 NOTE — Progress Notes (Signed)
Dear Alisha [name],  Thank you for referring Alisha [name] to the [facility] on 08/02/2016.   Below is a summation of this patient's evaluation and recommendations.  Thank you for your referral. I will keep you informed about this patient's response to treatment.   If you have any questions please do not hesitate to contact me.   Sincerely,  Alisha Prows, MD [facility]   ______________________________________________________________________    NEW PATIENT NOTE  Referring Provider: Deneise Lever, MD Primary Provider: Leonides Sake, MD Date of office visit: 08/02/2016    Subjective:   Chief Complaint:  Alisha [name] (DOB: 1945/12/21) is a 71 y.o. female who presents to the clinic on 08/02/2016 with a chief complaint of Allergic Rhinitis  .     HPI: Alisha [name] presents to this clinic in evaluation of allergic disease. She has been a patient of Alisha [name] and has received immunotherapy for the past 20-25 years presently had at a every 2 week schedule. Her husband administers her immunotherapy. She believes that immunotherapy has helping prevent her from developing infections. She still has some nasal congestion and sneezing but no associated anosmia or headaches or ugly nasal discharge. There are no obvious provoking factors giving rise to her symptoms.  As well, Alisha [name] was stung by some unidentified flying insects this summer on her left shoulder and within several minutes developed red itchy hands and urticaria without any associated systemic or constitutional symptoms. This lasted approximately 2 hours even in the face of utilizing Benadryl. She was given an EpiPen about that point in time. She's not been stung since.  She did receive the flu vaccine this year.  Past Medical History:  Diagnosis Date  . ALLERGIC RHINITIS   . Recurrent sinusitis     History  reviewed. No pertinent surgical history.  Allergies as of 08/02/2016   No Known Allergies     Medication List      ALOE VERA PO Take 1 tablet by mouth daily.   CO Q 10 PO Take 1 tablet by mouth daily.   COLLAGEN PO Take by mouth daily. Marine Collagen   CRANBERRY PO Take by mouth daily.   EPINEPHrine 0.3 mg/0.3 mL Soaj injection Commonly known as:  EPI-PEN Inject 0.3 mLs (0.3 mg total) into the muscle once.   EYE SUPPORT PO Take by mouth daily.   FIBER PO Take by mouth daily. Fiber Benefits   FLORA-Q PO Take 1 capsule by mouth daily.   LEG VEIN & CIRCULATION PO Take by mouth daily.   HEALTHY HAIR/SKIN/NAILS PO Take by mouth daily.   multivitamin tablet Take 1 tablet by mouth daily.   NATURAL FIBER PO Take by mouth. Fibermucil   NONFORMULARY OR COMPOUNDED ITEM Allergy Vaccine 1:10 Given at [place] Take by mouth. Cholestracare   OMEGA 3 PO Take 1 capsule by mouth daily.   OVER THE COUNTER MEDICATION Phytoceramides   OVER THE COUNTER MEDICATION Take 1 capsule by mouth daily. Bone and body factors   Tuberculin-Allergy Syringes 27G X 1/2" 0.5 ML Misc Commonly known as:  B-D TB SYRINGE .5CC/27GX1/2" Use to administer allergy vaccine as directed.   VITAMIN C PO Take 1 tablet by mouth daily.   VITAMIN D PO Take 1 tablet by mouth daily.       Review of systems negative except as noted in HPI / PMHx  or noted below:  Review of Systems  Constitutional: Negative.   HENT: Negative.   Eyes: Negative.   Respiratory: Negative.   Cardiovascular: Negative.   Gastrointestinal: Negative.   Genitourinary: Negative.   Musculoskeletal: Negative.   Skin: Negative.   Neurological: Negative.   Endo/Heme/Allergies: Negative.   Psychiatric/Behavioral: Negative.     Family History  Problem Relation Age of Onset  . Lung cancer Father   . Hypertension Mother   . Colon cancer Neg Hx     Social History   Social History  .  Marital status: Married    Spouse name: N/A  . Number of children: N/A  . Years of education: N/A   Occupational History  . Not on file.   Social History Main Topics  . Smoking status: Former Smoker    Packs/day: 1.00    Years: 15.00    Types: Cigarettes    Quit date: 07/11/1974  . Smokeless tobacco: Never Used  . Alcohol use 3.6 oz/week    6 Glasses of wine per week  . Drug use: No  . Sexual activity: Not on file   Other Topics Concern  . Not on file   Social History Narrative  . No narrative on file    Environmental and Social history  Lives in a house with a dry environment, a dog located inside the household, carpeting in the bedroom, no plastic on the bed with plastic on the pillow, no smoking ongoing with inside the household  Objective:   Vitals:   08/02/16 1438  BP: 140/82  Pulse: 80  Resp: 16  Temp: 98.6 F (37 C)   Height: 5\' 3"  (160 cm) Weight: 136 lb 12.8 oz (62.1 kg)  Physical Exam  Constitutional: She is well-developed, well-nourished, and in no distress.  HENT:  Head: Normocephalic. Head is without right periorbital erythema and without left periorbital erythema.  Right Ear: Tympanic membrane, external ear and ear canal normal.  Left Ear: Tympanic membrane, external ear and ear canal normal.  Nose: Nose normal. No mucosal edema or rhinorrhea.  Mouth/Throat: Uvula is midline, oropharynx is clear and moist and mucous membranes are normal. No oropharyngeal exudate.  Eyes: Conjunctivae and lids are normal. Pupils are equal, round, and reactive to light.  Neck: Trachea normal. No tracheal tenderness present. No tracheal deviation present. No thyromegaly present.  Cardiovascular: Normal rate, regular rhythm, S1 normal, S2 normal and normal heart sounds.   No murmur heard. Pulmonary/Chest: Effort normal and breath sounds normal. No stridor. No tachypnea. No respiratory distress. She has no wheezes. She has no rales. She exhibits no tenderness.    Abdominal: Soft. She exhibits no distension and no mass. There is no hepatosplenomegaly. There is no tenderness. There is no rebound and no guarding.  Musculoskeletal: She exhibits no edema or tenderness.  Lymphadenopathy:       Head (right side): No tonsillar adenopathy present.       Head (left side): No tonsillar adenopathy present.    She has no cervical adenopathy.    She has no axillary adenopathy.  Neurological: She is alert. Gait normal.  Skin: No rash noted. She is not diaphoretic. No erythema. No pallor. Nails show no clubbing.  Psychiatric: Mood and affect normal.    Diagnostics: Allergy skin tests were performed. She demonstrated hypersensitivity against tree pollen.  Assessment and Plan:    1. Other allergic rhinitis   2. Hymenoptera allergy     1. Allergen avoidance measures  2. Treat inflammation with  OTC Rhinocort 1 spray each nostril 3-7 times per week  3. If needed:   A. nasal saline spray  B. OTC antihistamine - Claritin/Zyrtec  C. EpiPen, Benadryl, M.D./ER evaluation for allergic reaction  4. Blood: Hymenoptera venom panel  5. Immunotherapy?  6. Return to clinic in 6 months or earlier if problem  I will have [name] start some nasal steroid which should help her congestive issue involving her upper airways and her sneezing in conjunction with allergen avoidance measures. For now we'll hold off on immunotherapy and make a decision about whether or not she requires immunotherapy for her aero allergen allergy based upon what happens in the face of this approach. I would like for her to have further evaluation for her apparent Hymenoptera venom allergy by checking IgE specific antibodies directed against venom and we'll make a decision about immunotherapy for that condition based upon the results of that test. If those tests are inconclusive we'll see her in our upcoming venom clinic.  Alisha Prows, MD [facility]

## 2016-08-02 NOTE — Patient Instructions (Addendum)
  1. Allergen avoidance measures  2. Treat inflammation with OTC Rhinocort 1 spray each nostril 3-7 times per week  3. If needed:   A. nasal saline spray  B. OTC antihistamine - Claritin/Zyrtec  C. EpiPen, Benadryl, M.D./ER evaluation for allergic reaction  4. Blood: Hymenoptera venom panel  5. Immunotherapy?  6. Return to clinic in 6 months or earlier if problem

## 2016-09-04 LAB — HYMENOPTERA PROFILE 2
Honeybee IgE: <0.1 kU/L
I002-IGE HORNET, WHITE FACE: 1.26 kU/L — AB
I005-IGE HORNET, YELLOW: 0.79 kU/L — AB
Paper Wasp IgE: 3.18 kU/L — AB
Yellow Jacket, IgE: 2.61 kU/L — AB

## 2016-09-19 ENCOUNTER — Ambulatory Visit: Payer: Managed Care, Other (non HMO)

## 2016-09-20 ENCOUNTER — Ambulatory Visit (INDEPENDENT_AMBULATORY_CARE_PROVIDER_SITE_OTHER): Payer: Managed Care, Other (non HMO) | Admitting: *Deleted

## 2016-09-20 DIAGNOSIS — T63441D Toxic effect of venom of bees, accidental (unintentional), subsequent encounter: Secondary | ICD-10-CM | POA: Diagnosis not present

## 2016-09-21 NOTE — Progress Notes (Signed)
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 Frequency:2 times per week Epi-Pen:Epi-Pen Available  Consent signed and patient instructions given. No problems after 30 minutes in the office.    Constance Holster 09/21/2016, 11:11 AM

## 2016-09-27 ENCOUNTER — Ambulatory Visit (INDEPENDENT_AMBULATORY_CARE_PROVIDER_SITE_OTHER): Payer: Managed Care, Other (non HMO) | Admitting: *Deleted

## 2016-09-27 DIAGNOSIS — T63441D Toxic effect of venom of bees, accidental (unintentional), subsequent encounter: Secondary | ICD-10-CM | POA: Diagnosis not present

## 2016-09-30 ENCOUNTER — Ambulatory Visit (INDEPENDENT_AMBULATORY_CARE_PROVIDER_SITE_OTHER): Payer: Managed Care, Other (non HMO) | Admitting: *Deleted

## 2016-09-30 DIAGNOSIS — T63441D Toxic effect of venom of bees, accidental (unintentional), subsequent encounter: Secondary | ICD-10-CM

## 2016-10-10 ENCOUNTER — Ambulatory Visit (INDEPENDENT_AMBULATORY_CARE_PROVIDER_SITE_OTHER): Payer: Managed Care, Other (non HMO) | Admitting: *Deleted

## 2016-10-10 DIAGNOSIS — T63441D Toxic effect of venom of bees, accidental (unintentional), subsequent encounter: Secondary | ICD-10-CM

## 2016-10-17 ENCOUNTER — Ambulatory Visit (INDEPENDENT_AMBULATORY_CARE_PROVIDER_SITE_OTHER): Payer: Managed Care, Other (non HMO) | Admitting: *Deleted

## 2016-10-17 DIAGNOSIS — T63441D Toxic effect of venom of bees, accidental (unintentional), subsequent encounter: Secondary | ICD-10-CM | POA: Diagnosis not present

## 2016-10-17 DIAGNOSIS — J309 Allergic rhinitis, unspecified: Secondary | ICD-10-CM

## 2016-10-24 ENCOUNTER — Ambulatory Visit (INDEPENDENT_AMBULATORY_CARE_PROVIDER_SITE_OTHER): Payer: Managed Care, Other (non HMO) | Admitting: *Deleted

## 2016-10-24 DIAGNOSIS — T63441D Toxic effect of venom of bees, accidental (unintentional), subsequent encounter: Secondary | ICD-10-CM

## 2016-10-31 ENCOUNTER — Ambulatory Visit (INDEPENDENT_AMBULATORY_CARE_PROVIDER_SITE_OTHER): Payer: Managed Care, Other (non HMO) | Admitting: *Deleted

## 2016-10-31 DIAGNOSIS — T63441D Toxic effect of venom of bees, accidental (unintentional), subsequent encounter: Secondary | ICD-10-CM | POA: Diagnosis not present

## 2016-11-08 ENCOUNTER — Ambulatory Visit (INDEPENDENT_AMBULATORY_CARE_PROVIDER_SITE_OTHER): Payer: Managed Care, Other (non HMO) | Admitting: *Deleted

## 2016-11-08 DIAGNOSIS — T63441D Toxic effect of venom of bees, accidental (unintentional), subsequent encounter: Secondary | ICD-10-CM | POA: Diagnosis not present

## 2016-11-14 ENCOUNTER — Ambulatory Visit (INDEPENDENT_AMBULATORY_CARE_PROVIDER_SITE_OTHER): Payer: Managed Care, Other (non HMO)

## 2016-11-14 DIAGNOSIS — T63441D Toxic effect of venom of bees, accidental (unintentional), subsequent encounter: Secondary | ICD-10-CM | POA: Diagnosis not present

## 2016-11-22 ENCOUNTER — Ambulatory Visit (INDEPENDENT_AMBULATORY_CARE_PROVIDER_SITE_OTHER): Payer: Managed Care, Other (non HMO) | Admitting: *Deleted

## 2016-11-22 DIAGNOSIS — T63441D Toxic effect of venom of bees, accidental (unintentional), subsequent encounter: Secondary | ICD-10-CM | POA: Diagnosis not present

## 2016-11-25 ENCOUNTER — Telehealth: Payer: Self-pay | Admitting: Allergy and Immunology

## 2016-11-25 MED ORDER — FLUTICASONE PROPIONATE 50 MCG/ACT NA SUSP: 2.0000 | Freq: Every day | NASAL | 1 refills | Status: DC

## 2016-11-25 NOTE — Telephone Encounter (Signed)
Please advise 

## 2016-11-25 NOTE — Telephone Encounter (Signed)
patient has a sinus infection with an ear ache for 24 hours - can something be called in? she comes weekly for shots

## 2016-11-25 NOTE — Telephone Encounter (Signed)
24 hours of symptoms is more consistent with a viral infection and antibiotics are no indicated at this time. I would recommend using nasal saline rinses 2-3 times per week and starting Mucinex 1200mg  twice daily to help thin out the mucous. She should also start a nasal steroid like fluticasone two sprays per nostril daily. She can call us on Monday with an update.   Salvatore Marvel, MD [facility]

## 2016-11-25 NOTE — Telephone Encounter (Signed)
Called patient to inform her of the information from Dr. Ernst Bowler. I sent in Fluticasone to the [facility] in [place]. Patient stated she will call back Monday with an update.

## 2016-11-28 ENCOUNTER — Telehealth: Payer: Self-pay | Admitting: Allergy and Immunology

## 2016-11-28 ENCOUNTER — Ambulatory Visit (INDEPENDENT_AMBULATORY_CARE_PROVIDER_SITE_OTHER): Payer: Managed Care, Other (non HMO) | Admitting: *Deleted

## 2016-11-28 DIAGNOSIS — T63441D Toxic effect of venom of bees, accidental (unintentional), subsequent encounter: Secondary | ICD-10-CM | POA: Diagnosis not present

## 2016-11-28 NOTE — Telephone Encounter (Signed)
Patient now has a questions - wants to know how long she needs to continue the nasal spray that she was given. Please call pt at [phone].Alisha [name]

## 2016-11-28 NOTE — Telephone Encounter (Signed)
I would recommended continuing the nasal spray for 14 days total. This should help until her viral infection has resolved. Please ensure that she has a follow up appointment with Dr. Neldon Mc.   Thanks, Salvatore Marvel, MD [facility]

## 2016-11-28 NOTE — Telephone Encounter (Signed)
Please advise 

## 2016-11-28 NOTE — Telephone Encounter (Signed)
Called patient to inform her to use for 14 days and to follow up with Dr. Neldon Mc. Patient has already made an appointment to follow up. I also recommended that she use simple saline before use of Flonase.

## 2016-11-28 NOTE — Telephone Encounter (Signed)
This is the patient from Friday that called.

## 2016-11-28 NOTE — Telephone Encounter (Signed)
fyi

## 2016-11-28 NOTE — Telephone Encounter (Signed)
Patient called and said she wanted to give an update on her condition. She talked to a nurse Friday and the nurse asked her to call back today with an update. She said she is doing much better. Her earache is gone. She is still stuffy,though.

## 2016-12-06 ENCOUNTER — Ambulatory Visit (INDEPENDENT_AMBULATORY_CARE_PROVIDER_SITE_OTHER): Payer: Managed Care, Other (non HMO)

## 2016-12-06 DIAGNOSIS — T63441D Toxic effect of venom of bees, accidental (unintentional), subsequent encounter: Secondary | ICD-10-CM | POA: Diagnosis not present

## 2016-12-12 ENCOUNTER — Ambulatory Visit (INDEPENDENT_AMBULATORY_CARE_PROVIDER_SITE_OTHER): Payer: Managed Care, Other (non HMO) | Admitting: *Deleted

## 2016-12-12 DIAGNOSIS — T63441D Toxic effect of venom of bees, accidental (unintentional), subsequent encounter: Secondary | ICD-10-CM

## 2016-12-19 ENCOUNTER — Ambulatory Visit (INDEPENDENT_AMBULATORY_CARE_PROVIDER_SITE_OTHER): Payer: Managed Care, Other (non HMO)

## 2016-12-19 DIAGNOSIS — T63441D Toxic effect of venom of bees, accidental (unintentional), subsequent encounter: Secondary | ICD-10-CM | POA: Diagnosis not present

## 2016-12-27 ENCOUNTER — Ambulatory Visit (INDEPENDENT_AMBULATORY_CARE_PROVIDER_SITE_OTHER): Payer: Managed Care, Other (non HMO) | Admitting: Allergy and Immunology

## 2016-12-27 ENCOUNTER — Telehealth: Payer: Self-pay | Admitting: Allergy and Immunology

## 2016-12-27 ENCOUNTER — Encounter: Payer: Self-pay | Admitting: Allergy and Immunology

## 2016-12-27 VITALS — BP 120/80 | HR 85 | Resp 16

## 2016-12-27 DIAGNOSIS — J3089 Other allergic rhinitis: Secondary | ICD-10-CM

## 2016-12-27 DIAGNOSIS — Z91038 Other insect allergy status: Secondary | ICD-10-CM

## 2016-12-27 DIAGNOSIS — Z91030 Bee allergy status: Secondary | ICD-10-CM

## 2016-12-27 NOTE — Telephone Encounter (Signed)
Patient called about her bill, DOS 5-7. She is seen in [place] and got a bill for $118.52 from there and she said she got another bill from [facility] for $29.63. She said she has never been seen in [facility]. She would like to know what the two bills are for. She does get injections. She will only be at this number for another hour. Then she can be reached at this number in the afternoon.

## 2016-12-27 NOTE — Progress Notes (Signed)
Follow-up Note  Referring Provider: Leonides Sake, MD Primary Provider: Leonides Sake, MD Date of Office Visit: 12/27/2016  Subjective:   Alisha [name] (DOB: Jun 02, 1946) is a 71 y.o. female who returns to the [facility] [place] on 12/27/2016 in re-evaluation of the following:  HPI: Alisha [name] returns to this clinic in reevaluation of her allergic rhinitis and Hymenoptera venom hypersensitivity state. I last saw her in this clinic January 2018.  Alisha [name] is doing quite well with her immunotherapy directed against mixed vespid and wasp. She is presently using this immunotherapy every week. She has had no adverse effect from using this form of treatment. She does have an EpiPen.  Her nose is doing very well. She uses a over-the-counter Rhinocort spray every day and also uses a nasal lavage. She went through the tree season without any problem. She has not required an antibiotic or steroid to treat any type of respiratory tract issue since I have seen her in this clinic.  Allergies as of 12/27/2016   No Known Allergies     Medication List      ALOE VERA PO Take 1 tablet by mouth daily.   CO Q 10 PO Take 1 tablet by mouth daily.   COLLAGEN PO Take by mouth daily. Marine Collagen   CRANBERRY PO Take by mouth daily.   EPINEPHrine 0.3 mg/0.3 mL Soaj injection Commonly known as:  EPI-PEN Use as directed for life-threatening allergic reaction.   EYE SUPPORT PO Take by mouth daily.   FIBER PO Take by mouth daily. Fiber Benefits   FLORA-Q PO Take 1 capsule by mouth daily.   GARLIC PO Take by mouth daily.   LEG VEIN & CIRCULATION PO Take by mouth daily.   HEALTHY HAIR/SKIN/NAILS PO Take by mouth daily.   multivitamin tablet Take 1 tablet by mouth daily.   NATURAL FIBER PO Take by mouth. Fibermucil   NONFORMULARY OR COMPOUNDED ITEM Allergy Vaccine 1:10 Given at [place] Take by mouth. Cholestracare   NUTRITIONAL  SUPPLEMENT PO Take by mouth. K2 (MK7) supplement   OMEGA 3 PO Take 1 capsule by mouth daily.   OVER THE COUNTER MEDICATION Phytoceramides   OVER THE COUNTER MEDICATION Take 1 capsule by mouth daily. Bone and body factors   RHINOCORT ALLERGY 32 MCG/ACT nasal spray Generic drug:  budesonide Place 1 spray into both nostrils daily.   VITAMIN C PO Take 1 tablet by mouth daily.   VITAMIN D PO Take 1 tablet by mouth daily.       Past Medical History:  Diagnosis Date  . ALLERGIC RHINITIS   . Hymenoptera allergy   . Recurrent sinusitis     History reviewed. No pertinent surgical history.  Review of systems negative except as noted in HPI / PMHx or noted below:  Review of Systems  Constitutional: Negative.   HENT: Negative.   Eyes: Negative.   Respiratory: Negative.   Cardiovascular: Negative.   Gastrointestinal: Negative.   Genitourinary: Negative.   Musculoskeletal: Negative.   Skin: Negative.   Neurological: Negative.   Endo/Heme/Allergies: Negative.   Psychiatric/Behavioral: Negative.      Objective:   Vitals:   12/27/16 1133  BP: 120/80  Pulse: 85  Resp: 16          Physical Exam  Constitutional: She is well-developed, well-nourished, and in no distress.  HENT:  Head: Normocephalic.  Right Ear: Tympanic membrane, external ear and ear canal normal.  Left  Ear: Tympanic membrane, external ear and ear canal normal.  Nose: Nose normal. No mucosal edema or rhinorrhea.  Mouth/Throat: Uvula is midline, oropharynx is clear and moist and mucous membranes are normal. No oropharyngeal exudate.  Eyes: Conjunctivae are normal.  Neck: Trachea normal. No tracheal tenderness present. No tracheal deviation present. No thyromegaly present.  Cardiovascular: Normal rate, regular rhythm, S1 normal, S2 normal and normal heart sounds.   No murmur heard. Pulmonary/Chest: Breath sounds normal. No stridor. No respiratory distress. She has no wheezes. She has no rales.    Musculoskeletal: She exhibits no edema.  Lymphadenopathy:       Head (right side): No tonsillar adenopathy present.       Head (left side): No tonsillar adenopathy present.    She has no cervical adenopathy.  Neurological: She is alert. Gait normal.  Skin: No rash noted. She is not diaphoretic. No erythema. Nails show no clubbing.  Psychiatric: Mood and affect normal.    Diagnostics: Results of blood tests obtained 08/30/2016 identified high titers of IgE antibodies directed against various forms of Hymenoptera venom including white faced hornet at 1.26, yellow jacket 2.61, yellow faced hornet at 0.79, and wasp at 3.18 KU/L based upon the results of these blood tests Alisha [name] started immunotherapy directed against mixed vespids and wasp  Assessment and Plan:   1. Hymenoptera allergy   2. Other allergic rhinitis     1. Continue to perform Allergen avoidance measures  2. Continue to Treat inflammation with OTC Rhinocort 1 spray each nostril 3-7 times per week  3. If needed:   A. nasal saline spray  B. OTC antihistamine - Claritin/Zyrtec  C. EpiPen, Benadryl, M.D./ER evaluation for allergic reaction  4. Continue immunotherapy directed against Hymenoptera venom  5. Return to clinic in 6 months or earlier if problem  Alisha [name] is doing quite well on her plan. She will continue to use immunotherapy directed against Hymenoptera venom and she will continue to use anti-inflammatory medications for her upper airway atopic disease. I will see her back in this clinic in 6 months or earlier if there is a problem.   Alisha Katz, MD [facility] / [facility] [place]

## 2016-12-27 NOTE — Telephone Encounter (Signed)
Explained to pt that Alisha [name] usually works in [employer] & she just did not sign in as Alisha [name] that day - kt

## 2016-12-27 NOTE — Patient Instructions (Addendum)
  1. Continue to perform Allergen avoidance measures  2. Continue to Treat inflammation with OTC Rhinocort 1 spray each nostril 3-7 times per week  3. If needed:   A. nasal saline spray  B. OTC antihistamine - Claritin/Zyrtec  C. EpiPen, Benadryl, M.D./ER evaluation for allergic reaction  4. Continue immunotherapy directed against Hymenoptera venom  5. Return to clinic in 6 months or earlier if problem

## 2017-01-05 ENCOUNTER — Ambulatory Visit (INDEPENDENT_AMBULATORY_CARE_PROVIDER_SITE_OTHER): Payer: Managed Care, Other (non HMO) | Admitting: *Deleted

## 2017-01-05 DIAGNOSIS — T63441D Toxic effect of venom of bees, accidental (unintentional), subsequent encounter: Secondary | ICD-10-CM | POA: Diagnosis not present

## 2017-01-10 ENCOUNTER — Ambulatory Visit (INDEPENDENT_AMBULATORY_CARE_PROVIDER_SITE_OTHER): Payer: Managed Care, Other (non HMO)

## 2017-01-10 DIAGNOSIS — T63441D Toxic effect of venom of bees, accidental (unintentional), subsequent encounter: Secondary | ICD-10-CM | POA: Diagnosis not present

## 2017-01-17 ENCOUNTER — Ambulatory Visit (INDEPENDENT_AMBULATORY_CARE_PROVIDER_SITE_OTHER): Payer: Managed Care, Other (non HMO) | Admitting: *Deleted

## 2017-01-17 DIAGNOSIS — T63441D Toxic effect of venom of bees, accidental (unintentional), subsequent encounter: Secondary | ICD-10-CM

## 2017-01-23 ENCOUNTER — Ambulatory Visit (INDEPENDENT_AMBULATORY_CARE_PROVIDER_SITE_OTHER): Payer: Managed Care, Other (non HMO) | Admitting: *Deleted

## 2017-01-23 DIAGNOSIS — T63441D Toxic effect of venom of bees, accidental (unintentional), subsequent encounter: Secondary | ICD-10-CM | POA: Diagnosis not present

## 2017-01-30 ENCOUNTER — Ambulatory Visit (INDEPENDENT_AMBULATORY_CARE_PROVIDER_SITE_OTHER): Payer: Managed Care, Other (non HMO)

## 2017-01-30 DIAGNOSIS — T63441D Toxic effect of venom of bees, accidental (unintentional), subsequent encounter: Secondary | ICD-10-CM

## 2017-02-06 ENCOUNTER — Ambulatory Visit (INDEPENDENT_AMBULATORY_CARE_PROVIDER_SITE_OTHER): Payer: Managed Care, Other (non HMO)

## 2017-02-06 DIAGNOSIS — T63441D Toxic effect of venom of bees, accidental (unintentional), subsequent encounter: Secondary | ICD-10-CM | POA: Diagnosis not present

## 2017-02-13 ENCOUNTER — Ambulatory Visit (INDEPENDENT_AMBULATORY_CARE_PROVIDER_SITE_OTHER): Payer: Managed Care, Other (non HMO)

## 2017-02-13 DIAGNOSIS — T63441D Toxic effect of venom of bees, accidental (unintentional), subsequent encounter: Secondary | ICD-10-CM | POA: Diagnosis not present

## 2017-02-20 ENCOUNTER — Ambulatory Visit (INDEPENDENT_AMBULATORY_CARE_PROVIDER_SITE_OTHER): Payer: Managed Care, Other (non HMO)

## 2017-02-20 DIAGNOSIS — T63441D Toxic effect of venom of bees, accidental (unintentional), subsequent encounter: Secondary | ICD-10-CM | POA: Diagnosis not present

## 2017-02-28 ENCOUNTER — Ambulatory Visit (INDEPENDENT_AMBULATORY_CARE_PROVIDER_SITE_OTHER): Payer: Managed Care, Other (non HMO) | Admitting: *Deleted

## 2017-02-28 DIAGNOSIS — T63441D Toxic effect of venom of bees, accidental (unintentional), subsequent encounter: Secondary | ICD-10-CM | POA: Diagnosis not present

## 2017-03-06 ENCOUNTER — Ambulatory Visit (INDEPENDENT_AMBULATORY_CARE_PROVIDER_SITE_OTHER): Payer: Managed Care, Other (non HMO)

## 2017-03-06 DIAGNOSIS — T63441D Toxic effect of venom of bees, accidental (unintentional), subsequent encounter: Secondary | ICD-10-CM | POA: Diagnosis not present

## 2017-03-14 ENCOUNTER — Ambulatory Visit (INDEPENDENT_AMBULATORY_CARE_PROVIDER_SITE_OTHER): Payer: Managed Care, Other (non HMO)

## 2017-03-14 DIAGNOSIS — T63441D Toxic effect of venom of bees, accidental (unintentional), subsequent encounter: Secondary | ICD-10-CM | POA: Diagnosis not present

## 2017-03-20 ENCOUNTER — Ambulatory Visit (INDEPENDENT_AMBULATORY_CARE_PROVIDER_SITE_OTHER): Payer: Managed Care, Other (non HMO)

## 2017-03-20 DIAGNOSIS — T63441D Toxic effect of venom of bees, accidental (unintentional), subsequent encounter: Secondary | ICD-10-CM

## 2017-03-27 ENCOUNTER — Ambulatory Visit (INDEPENDENT_AMBULATORY_CARE_PROVIDER_SITE_OTHER): Payer: Managed Care, Other (non HMO)

## 2017-03-27 DIAGNOSIS — T63441D Toxic effect of venom of bees, accidental (unintentional), subsequent encounter: Secondary | ICD-10-CM

## 2017-04-03 ENCOUNTER — Ambulatory Visit (INDEPENDENT_AMBULATORY_CARE_PROVIDER_SITE_OTHER): Payer: Managed Care, Other (non HMO) | Admitting: *Deleted

## 2017-04-03 DIAGNOSIS — J309 Allergic rhinitis, unspecified: Secondary | ICD-10-CM

## 2017-04-10 ENCOUNTER — Ambulatory Visit (INDEPENDENT_AMBULATORY_CARE_PROVIDER_SITE_OTHER): Payer: Managed Care, Other (non HMO) | Admitting: *Deleted

## 2017-04-10 DIAGNOSIS — T63441D Toxic effect of venom of bees, accidental (unintentional), subsequent encounter: Secondary | ICD-10-CM | POA: Diagnosis not present

## 2017-04-25 ENCOUNTER — Ambulatory Visit (INDEPENDENT_AMBULATORY_CARE_PROVIDER_SITE_OTHER): Payer: Managed Care, Other (non HMO) | Admitting: *Deleted

## 2017-04-25 DIAGNOSIS — T63441D Toxic effect of venom of bees, accidental (unintentional), subsequent encounter: Secondary | ICD-10-CM

## 2017-05-16 ENCOUNTER — Ambulatory Visit (INDEPENDENT_AMBULATORY_CARE_PROVIDER_SITE_OTHER): Payer: Managed Care, Other (non HMO) | Admitting: *Deleted

## 2017-05-16 DIAGNOSIS — T63441D Toxic effect of venom of bees, accidental (unintentional), subsequent encounter: Secondary | ICD-10-CM | POA: Diagnosis not present

## 2017-06-13 ENCOUNTER — Encounter: Payer: Self-pay | Admitting: Allergy and Immunology

## 2017-06-13 ENCOUNTER — Ambulatory Visit (INDEPENDENT_AMBULATORY_CARE_PROVIDER_SITE_OTHER): Payer: 59 | Admitting: Allergy and Immunology

## 2017-06-13 VITALS — BP 130/74 | HR 72 | Resp 20

## 2017-06-13 DIAGNOSIS — T63441D Toxic effect of venom of bees, accidental (unintentional), subsequent encounter: Secondary | ICD-10-CM | POA: Diagnosis not present

## 2017-06-13 DIAGNOSIS — J309 Allergic rhinitis, unspecified: Secondary | ICD-10-CM | POA: Diagnosis not present

## 2017-06-13 NOTE — Progress Notes (Signed)
Follow-up Note  Referring Provider: Leonides Sake, MD Primary Provider: Leonides Sake, MD Date of Office Visit: 06/13/2017  Subjective:   Alisha [name] (DOB: 12/09/1945) is a 71 y.o. female who returns to the [facility] on 06/13/2017 in re-evaluation of the following:  HPI: Alisha [name] returns to this clinic in reevaluation of her hymenoptera venom hypersensitivity state treated with immunotherapy and allergic rhinitis.  Her last visit to this clinic was 27 December 2016.  Currently her immunotherapy is every month.  She has not had an adverse effect secondary to this form of treatment. She has not being stung in the field.  Her nose is doing quite well.  She did have what sounded like an upper respiratory tract infection A few months back for which she used a nasal steroid but for the most part does not use nasal steroids on a regular basis but uses nasal saline daily. It does not sound as though she has required a systemic steroid or an antibiotic to treat her respiratory tract problem.  Allergies as of 06/13/2017   No Known Allergies     Medication List      ALOE VERA PO Take 1 tablet by mouth daily.   CO Q 10 PO Take 1 tablet by mouth daily.   COLLAGEN PO Take by mouth daily. Marine Collagen   CRANBERRY PO Take by mouth daily.   EPINEPHrine 0.3 mg/0.3 mL Soaj injection Commonly known as:  EPI-PEN Use as directed for life-threatening allergic reaction.   EYE SUPPORT PO Take by mouth daily.   FIBER PO Take by mouth daily. Fiber Benefits   FLORA-Q PO Take 1 capsule by mouth daily.   GARLIC PO Take by mouth daily.   LEG VEIN & CIRCULATION PO Take by mouth daily.   HEALTHY HAIR/SKIN/NAILS PO Take by mouth daily.   multivitamin tablet Take 1 tablet by mouth daily.   NATURAL FIBER PO Take by mouth. Fibermucil   NONFORMULARY OR COMPOUNDED ITEM Allergy Vaccine 1:10 Given at [facility] Take by mouth.  Cholestracare   NUTRITIONAL SUPPLEMENT PO Take by mouth. K2 (MK7) supplement   OMEGA 3 PO Take 1 capsule by mouth daily.   OVER THE COUNTER MEDICATION Phytoceramides   OVER THE COUNTER MEDICATION Take 1 capsule by mouth daily. Bone and body factors   RHINOCORT ALLERGY 32 MCG/ACT nasal spray Generic drug:  budesonide Place 1 spray into both nostrils daily.   Tuberculin-Allergy Syringes 27G X 1/2" 0.5 ML Misc Commonly known as:  B-D TB SYRINGE .5CC/27GX1/2" Use to administer allergy vaccine as directed.   VITAMIN C PO Take 1 tablet by mouth daily.   VITAMIN D PO Take 1 tablet by mouth daily.       Past Medical History:  Diagnosis Date  . ALLERGIC RHINITIS   . Hymenoptera allergy   . Recurrent sinusitis     History reviewed. No pertinent surgical history.  Review of systems negative except as noted in HPI / PMHx or noted below:  Review of Systems  Constitutional: Negative.   HENT: Negative.   Eyes: Negative.   Respiratory: Negative.   Cardiovascular: Negative.   Gastrointestinal: Negative.   Genitourinary: Negative.   Musculoskeletal: Negative.   Skin: Negative.   Neurological: Negative.   Endo/Heme/Allergies: Negative.   Psychiatric/Behavioral: Negative.      Objective:   Vitals:   06/13/17 1503  BP: 130/74  Pulse: 72  Resp: 20  Physical Exam  Constitutional: She is well-developed, well-nourished, and in no distress.  HENT:  Head: Normocephalic.  Right Ear: Tympanic membrane, external ear and ear canal normal.  Left Ear: Tympanic membrane, external ear and ear canal normal.  Nose: Nose normal. No mucosal edema or rhinorrhea.  Mouth/Throat: Uvula is midline, oropharynx is clear and moist and mucous membranes are normal. No oropharyngeal exudate.  Eyes: Conjunctivae are normal.  Neck: Trachea normal. No tracheal tenderness present. No tracheal deviation present. No thyromegaly present.  Cardiovascular: Normal rate, regular rhythm, S1  normal, S2 normal and normal heart sounds.  No murmur heard. Pulmonary/Chest: Breath sounds normal. No stridor. No respiratory distress. She has no wheezes. She has no rales.  Musculoskeletal: She exhibits no edema.  Lymphadenopathy:       Head (right side): No tonsillar adenopathy present.       Head (left side): No tonsillar adenopathy present.    She has no cervical adenopathy.  Neurological: She is alert. Gait normal.  Skin: No rash noted. She is not diaphoretic. No erythema. Nails show no clubbing.  Psychiatric: Mood and affect normal.    Diagnostics: none   Assessment and Plan:   1. Toxic effect of venom of bees, unintentional, subsequent encounter   2. Allergic rhinitis, unspecified seasonality, unspecified trigger     1. Continue to perform Allergen avoidance measures  2. Continue to Treat inflammation with OTC Rhinocort 1 spray each nostril 3-7 times per week  3. If needed:   A. nasal saline spray  B. OTC antihistamine - Claritin/Zyrtec  C. EpiPen, Benadryl, M.D./ER evaluation for allergic reaction  4. Continue immunotherapy directed against Hymenoptera venom  5. Return to clinic in 12 months or earlier if problem  Alisha [name] appears to be doing relatively well.  She will continue on immunotherapy directed against hymenoptera venom and can use an over-the-counter nasal steroid during periods of upper airway symptoms and I will see her back in his clinic in 12 months or earlier if there is a problem.  [name], MD Allergy / Immunology [place]

## 2017-06-13 NOTE — Patient Instructions (Signed)
  1. Continue to perform Allergen avoidance measures  2. Continue to Treat inflammation with OTC Rhinocort 1 spray each nostril 3-7 times per week  3. If needed:   A. nasal saline spray  B. OTC antihistamine - Claritin/Zyrtec  C. EpiPen, Benadryl, M.D./ER evaluation for allergic reaction  4. Continue immunotherapy directed against Hymenoptera venom  5. Return to clinic in 12 months or earlier if problem

## 2017-06-14 ENCOUNTER — Encounter: Payer: Self-pay | Admitting: Allergy and Immunology

## 2017-07-12 ENCOUNTER — Ambulatory Visit (INDEPENDENT_AMBULATORY_CARE_PROVIDER_SITE_OTHER): Payer: 59 | Admitting: *Deleted

## 2017-07-12 DIAGNOSIS — T63441D Toxic effect of venom of bees, accidental (unintentional), subsequent encounter: Secondary | ICD-10-CM

## 2017-08-08 ENCOUNTER — Ambulatory Visit (INDEPENDENT_AMBULATORY_CARE_PROVIDER_SITE_OTHER): Payer: 59 | Admitting: *Deleted

## 2017-08-08 DIAGNOSIS — T63441D Toxic effect of venom of bees, accidental (unintentional), subsequent encounter: Secondary | ICD-10-CM

## 2017-09-05 ENCOUNTER — Ambulatory Visit (INDEPENDENT_AMBULATORY_CARE_PROVIDER_SITE_OTHER): Payer: 59 | Admitting: *Deleted

## 2017-09-05 DIAGNOSIS — T63441D Toxic effect of venom of bees, accidental (unintentional), subsequent encounter: Secondary | ICD-10-CM

## 2017-10-02 ENCOUNTER — Telehealth: Payer: Self-pay | Admitting: Allergy and Immunology

## 2017-10-02 NOTE — Telephone Encounter (Signed)
I called and left a detailed message for her to call back.

## 2017-10-02 NOTE — Telephone Encounter (Signed)
Patient called back for [facility]. Please return call.

## 2017-10-02 NOTE — Telephone Encounter (Signed)
Patient said she was stung by something, not sure what, this weekend on her lower forehead, near her eye. It has been swollen for 2 days. She said this is the second time within 4-5 weeks this has happened. She is due for a shot tomorrow and wants to know if she also needs to be seen. She will only be home until 9:30, then she won't be back until after 1:00. She said a message can also be left.

## 2017-10-02 NOTE — Telephone Encounter (Signed)
I spoke with patient and she informed me that this has occurred 2 times in the past 4-5 weeks. States that the first occurrence she had 2 small "holes" and redness with swelling that occurred 24 hours later. Said that benadryl did help that time. This time her eye is still swollen and it has been 2 days since occurrence began. She is only taking a child's dosing of Benadryl. I did let her know that she should go ahead and take an adult dose of 25 mg and then in 4-6 hours she should take another if needed. I have her scheduled to see Dr. Neldon Mc 10-04-17 at 1015. Patient will arrive at 10 am.

## 2017-10-04 ENCOUNTER — Ambulatory Visit: Payer: 59 | Admitting: Allergy and Immunology

## 2017-10-04 ENCOUNTER — Encounter: Payer: Self-pay | Admitting: Allergy and Immunology

## 2017-10-04 VITALS — BP 124/70 | HR 82 | Resp 18

## 2017-10-04 DIAGNOSIS — T63441D Toxic effect of venom of bees, accidental (unintentional), subsequent encounter: Secondary | ICD-10-CM | POA: Diagnosis not present

## 2017-10-04 DIAGNOSIS — T7840XA Allergy, unspecified, initial encounter: Secondary | ICD-10-CM | POA: Diagnosis not present

## 2017-10-04 DIAGNOSIS — J309 Allergic rhinitis, unspecified: Secondary | ICD-10-CM | POA: Diagnosis not present

## 2017-10-04 NOTE — Progress Notes (Signed)
Follow-up Note  Referring Provider: Leonides Sake, MD Primary Provider: Leonides Sake, MD Date of Office Visit: 10/04/2017  Subjective:   Alisha [name] (DOB: 11-29-1945) is a 72 y.o. female who returns to the [facility] on 10/04/2017 in re-evaluation of the following:  HPI: Alisha [name] returns to this clinic in reevaluation of her hymenoptera venom hypersensitivity state, allergic rhinitis, and a swelling problem.  I last saw her in this clinic 13 June 2017.  She has really been doing quite well with her hymenoptera venom immunotherapy currently at every 4 weeks without any adverse effect.  She has really been doing well with her nasal congestion and sneezing which is basically an invisible issue while using over-the-counter Rhinocort a few times a week and occasional nasal lavage.  She has not required a systemic steroid or antibiotic to treat any type of respiratory tract issue since being seen in this clinic.  Over the course of the past 2 weeks or so she has had 2 episodes of eye swelling.  Her first episode affected her left eye initially starting at the supra orbital region and became red and swollen for which she took Benadryl and it resolved within a few days.  Her second episode occurred last week and it involved her right eye and was a little more extensive than her left reaction and also was treated with Benadryl and is slowly resolving although she still has a tiny bit of eyelid swelling.  There is no associated systemic or constitutional symptoms.  There is no obvious trigger.  She has not started any new medications and she has not had any significant environmental changes.  Allergies as of 10/04/2017   No Known Allergies     Medication List      ALOE VERA PO Take 1 tablet by mouth daily.   CO Q 10 PO Take 1 tablet by mouth daily.   COLLAGEN PO Take by mouth daily. Marine Collagen   CRANBERRY PO Take by mouth daily.   EPINEPHrine 0.3  mg/0.3 mL Soaj injection Commonly known as:  EPI-PEN Use as directed for life-threatening allergic reaction.   EYE SUPPORT PO Take by mouth daily.   FIBER PO Take by mouth daily. Fiber Benefits   FLORA-Q PO Take 1 capsule by mouth daily.   GARLIC PO Take by mouth daily.   LEG VEIN & CIRCULATION PO Take by mouth daily.   HEALTHY HAIR/SKIN/NAILS PO Take by mouth daily.   multivitamin tablet Take 1 tablet by mouth daily.   NATURAL FIBER PO Take by mouth. Fibermucil   NONFORMULARY OR COMPOUNDED ITEM Allergy Vaccine 1:10 Given at [place] Take by mouth. Cholestracare   NUTRITIONAL SUPPLEMENT PO Take by mouth. K2 (MK7) supplement   OMEGA 3 PO Take 1 capsule by mouth daily.   OVER THE COUNTER MEDICATION Phytoceramides   OVER THE COUNTER MEDICATION Take 1 capsule by mouth daily. Bone and body factors   RHINOCORT ALLERGY 32 MCG/ACT nasal spray Generic drug:  budesonide Place 1 spray into both nostrils daily.   VITAMIN C PO Take 1 tablet by mouth daily.       Past Medical History:  Diagnosis Date  . ALLERGIC RHINITIS   . Hymenoptera allergy   . Recurrent sinusitis     History reviewed. No pertinent surgical history.  Review of systems negative except as noted in HPI / PMHx or noted below:  Review of Systems  Constitutional: Negative.  HENT: Negative.   Eyes: Negative.   Respiratory: Negative.   Cardiovascular: Negative.   Gastrointestinal: Negative.   Genitourinary: Negative.   Musculoskeletal: Negative.   Skin: Negative.   Neurological: Negative.   Endo/Heme/Allergies: Negative.   Psychiatric/Behavioral: Negative.      Objective:   Vitals:   10/04/17 0957  BP: 124/70  Pulse: 82  Resp: 18  SpO2: 98%          Physical Exam  Constitutional: She is well-developed, well-nourished, and in no distress.  HENT:  Head: Normocephalic.  Right Ear: Tympanic membrane, external ear and ear canal normal.  Left Ear:  Tympanic membrane, external ear and ear canal normal.  Nose: Nose normal. No mucosal edema or rhinorrhea.  Mouth/Throat: Uvula is midline, oropharynx is clear and moist and mucous membranes are normal. No oropharyngeal exudate.  Eyes: Conjunctivae are normal.  Slight upper eyelid swelling without erythema.  Neck: Trachea normal. No tracheal tenderness present. No tracheal deviation present. No thyromegaly present.  Cardiovascular: Normal rate, regular rhythm, S1 normal, S2 normal and normal heart sounds.  No murmur heard. Pulmonary/Chest: Breath sounds normal. No stridor. No respiratory distress. She has no wheezes. She has no rales.  Musculoskeletal: She exhibits no edema.  Lymphadenopathy:       Head (right side): No tonsillar adenopathy present.       Head (left side): No tonsillar adenopathy present.    She has no cervical adenopathy.  Neurological: She is alert. Gait normal.  Skin: No rash noted. She is not diaphoretic. No erythema. Nails show no clubbing.  Psychiatric: Mood and affect normal.    Diagnostics: none  Assessment and Plan:   1. Toxic effect of venom of bees, unintentional, subsequent encounter   2. Allergic rhinitis, unspecified seasonality, unspecified trigger   3. Allergic reaction, initial encounter     1. Every day use loratadine 10 mg daily for the next 6 weeks  2. Continue to Treat inflammation with OTC Rhinocort 1 spray each nostril 3-7 times per week  3. If needed:   A. nasal saline spray  B. EpiPen, Benadryl, M.D./ER evaluation for allergic reaction  4. Continue immunotherapy directed against Hymenoptera venom  5. Return to clinic in 12 months or earlier if problem  6.  Contact clinic if recurrent swelling reactions  Overall Alisha [name] is doing quite well with her current therapy directed against hymenoptera venom and atopic upper airway disease.  The cause of her 2 episodes of periorbital swelling is unknown but she does appear to have been able to  get this under very good control with over-the-counter antihistamine.  I will assume that she has some form of immunological hyperreactivity that will burn out over the course of the next several weeks and treat her with loratadine on a daily basis during that timeframe.  I have asked her to contact me should she have another swelling reaction at which point in time I think we will need to step forward looking for causes of swelling and also have her consider consolidating all of her supplement use as that may be the cause of her reaction.  We will see how things go over the course of the next few weeks to months.  [name], MD Allergy / Immunology [place]

## 2017-10-04 NOTE — Patient Instructions (Addendum)
  1. Every day use loratadine 10 mg daily for the next 6 weeks  2. Continue to Treat inflammation with OTC Rhinocort 1 spray each nostril 3-7 times per week  3. If needed:   A. nasal saline spray  B. EpiPen, Benadryl, M.D./ER evaluation for allergic reaction  4. Continue immunotherapy directed against Hymenoptera venom  5. Return to clinic in 12 months or earlier if problem  6.  Contact clinic if recurrent swelling reactions

## 2017-10-05 ENCOUNTER — Encounter: Payer: Self-pay | Admitting: Allergy and Immunology

## 2017-10-05 ENCOUNTER — Telehealth: Payer: Self-pay | Admitting: Allergy and Immunology

## 2017-10-05 NOTE — Telephone Encounter (Signed)
FYI Dr Neldon Mc

## 2017-10-05 NOTE — Telephone Encounter (Signed)
Patient has determined that it is her facial makeup that is causing her issues Please call patient with any questions

## 2017-10-17 ENCOUNTER — Ambulatory Visit: Payer: 59 | Admitting: Allergy and Immunology

## 2017-10-23 ENCOUNTER — Encounter: Payer: Self-pay | Admitting: Internal Medicine

## 2017-10-31 ENCOUNTER — Ambulatory Visit (INDEPENDENT_AMBULATORY_CARE_PROVIDER_SITE_OTHER): Payer: 59 | Admitting: *Deleted

## 2017-10-31 DIAGNOSIS — T63441D Toxic effect of venom of bees, accidental (unintentional), subsequent encounter: Secondary | ICD-10-CM | POA: Diagnosis not present

## 2017-11-09 ENCOUNTER — Encounter: Payer: Self-pay | Admitting: Internal Medicine

## 2017-11-28 ENCOUNTER — Ambulatory Visit (INDEPENDENT_AMBULATORY_CARE_PROVIDER_SITE_OTHER): Payer: 59 | Admitting: *Deleted

## 2017-11-28 DIAGNOSIS — T63441D Toxic effect of venom of bees, accidental (unintentional), subsequent encounter: Secondary | ICD-10-CM

## 2017-12-26 ENCOUNTER — Ambulatory Visit (INDEPENDENT_AMBULATORY_CARE_PROVIDER_SITE_OTHER): Payer: 59 | Admitting: *Deleted

## 2017-12-26 DIAGNOSIS — T63441D Toxic effect of venom of bees, accidental (unintentional), subsequent encounter: Secondary | ICD-10-CM

## 2018-01-19 ENCOUNTER — Encounter: Payer: PRIVATE HEALTH INSURANCE | Admitting: Internal Medicine

## 2018-01-23 ENCOUNTER — Ambulatory Visit (INDEPENDENT_AMBULATORY_CARE_PROVIDER_SITE_OTHER): Payer: 59 | Admitting: *Deleted

## 2018-01-23 DIAGNOSIS — T63441D Toxic effect of venom of bees, accidental (unintentional), subsequent encounter: Secondary | ICD-10-CM

## 2018-01-24 ENCOUNTER — Other Ambulatory Visit: Payer: Self-pay

## 2018-01-24 ENCOUNTER — Ambulatory Visit (AMBULATORY_SURGERY_CENTER): Payer: Self-pay

## 2018-01-24 VITALS — Ht 63.5 in | Wt 136.8 lb

## 2018-01-24 DIAGNOSIS — Z86010 Personal history of colonic polyps: Secondary | ICD-10-CM

## 2018-01-24 MED ORDER — NA SULFATE-K SULFATE-MG SULF 17.5-3.13-1.6 GM/177ML PO SOLN: 1.0000 | Freq: Once | ORAL | 0 refills | Status: AC

## 2018-01-24 NOTE — Progress Notes (Signed)
No egg or soy allergy known to patient  No issues with past sedation with any surgeries  or procedures, no intubation problems  No diet pills per patient No home 02 use per patient  No blood thinners per patient  Pt denies issues with constipation  No A fib or A flutter  EMMI video sent to pt's e mail  

## 2018-02-07 ENCOUNTER — Telehealth: Payer: Self-pay | Admitting: Internal Medicine

## 2018-02-07 ENCOUNTER — Ambulatory Visit (AMBULATORY_SURGERY_CENTER): Payer: PRIVATE HEALTH INSURANCE | Admitting: Internal Medicine

## 2018-02-07 ENCOUNTER — Encounter: Payer: Self-pay | Admitting: Internal Medicine

## 2018-02-07 VITALS — BP 138/85 | HR 89 | Temp 98.0°F | Resp 14 | Ht 62.0 in | Wt 136.0 lb

## 2018-02-07 DIAGNOSIS — Z86010 Personal history of colonic polyps: Secondary | ICD-10-CM | POA: Diagnosis present

## 2018-02-07 MED ORDER — SODIUM CHLORIDE 0.9 % IV SOLN: 500.0000 mL | Freq: Once | INTRAVENOUS | Status: DC

## 2018-02-07 NOTE — Op Note (Signed)
[facility] Patient Name: Alisha [name] Procedure Date: 02/07/2018 2:38 PM MRN: [id number] Endoscopist: Jerene Bears , MD Age: 72 Referring MD:  Date of Birth: [birth year] Gender: Female Account #: [id number] Procedure:                Colonoscopy Indications:              High risk colon cancer surveillance: Personal                            history of colonic polyps (low risk adenoma x 1,                            SSP x 1), Last colonoscopy 5 years ago Medicines:                Monitored Anesthesia Care Procedure:                Pre-Anesthesia Assessment:                           - Prior to the procedure, a History and Physical                            was performed, and patient medications and                            allergies were reviewed. The patient's tolerance of                            previous anesthesia was also reviewed. The risks                            and benefits of the procedure and the sedation                            options and risks were discussed with the patient.                            All questions were answered, and informed consent                            was obtained. Prior Anticoagulants: The patient has                            taken no previous anticoagulant or antiplatelet                            agents. ASA Grade Assessment: II - A patient with                            mild systemic disease. After reviewing the risks                            and benefits, the patient was deemed in  satisfactory condition to undergo the procedure.                           After obtaining informed consent, the colonoscope                            was passed under direct vision. Throughout the                            procedure, the patient's blood pressure, pulse, and                            oxygen saturations were monitored continuously. The                            Model PCF-H190DL  [id number]) scope was introduced                            through the anus and advanced to the cecum,                            identified by appendiceal orifice and ileocecal                            valve. The colonoscopy was performed without                            difficulty. The patient tolerated the procedure                            well. The quality of the bowel preparation was                            good. The ileocecal valve, appendiceal orifice, and                            rectum were photographed. Scope In: 2:43:26 PM Scope Out: 3:09:54 PM Scope Withdrawal Time: 0 hours 14 minutes 34 seconds  Total Procedure Duration: 0 hours 26 minutes 28 seconds  Findings:                 The digital rectal exam was normal.                           Multiple small and large-mouthed diverticula were                            found in the sigmoid colon, descending colon,                            transverse colon and hepatic flexure.                           Retroflexion in the rectum was not performed due to  anatomy. Complications:            No immediate complications. Estimated Blood Loss:     Estimated blood loss: none. Impression:               - Severe diverticulosis in the sigmoid colon, in                            the descending colon, in the transverse colon and                            at the hepatic flexure.                           - No specimens collected. Recommendation:           - Patient has a contact number available for                            emergencies. The signs and symptoms of potential                            delayed complications were discussed with the                            patient. Return to normal activities tomorrow.                            Written discharge instructions were provided to the                            patient.                           - Resume previous diet.                            - Continue present medications.                           - No repeat colonoscopy due to age at time of next                            screening (age > 45 years) and the absence of                            colonic polyps today. Jerene Bears, MD 02/07/2018 3:16:22 PM This report has been signed electronically.

## 2018-02-07 NOTE — Progress Notes (Signed)
Called to room to assist during endoscopic procedure.  Patient ID and intended procedure confirmed with present staff. Received instructions for my participation in the procedure from the performing physician.  

## 2018-02-07 NOTE — Progress Notes (Signed)
Report to PACU, RN, vss, BBS= Clear.  

## 2018-02-07 NOTE — Telephone Encounter (Signed)
Patient was concerned about going to the bathroom so much and didn't know if she should take her second prep. I advised her to definitely take her second prep as these were the desired results. She understood and had no further questions.

## 2018-02-07 NOTE — Telephone Encounter (Signed)
Patient with procedure today 7.31.19 states after taking the first part of her prep she has not stopped going to the rest room and is concerned. Patient wanting to know if she is suppose to take second prep still. Pt arrival time set for 1:30pm today.

## 2018-02-07 NOTE — Progress Notes (Signed)
Pt's states no medical or surgical changes since previsit or office visit. 

## 2018-02-07 NOTE — Patient Instructions (Signed)
Thank you for allowing [place] to care for you today!  Handout given for diverticulosis.  Please call Dr Hilarie Fredrickson if you have any questions or concerns.    YOU HAD AN ENDOSCOPIC PROCEDURE TODAY AT [facility]:   Refer to the procedure report that was given to you for any specific questions about what was found during the examination.  If the procedure report does not answer your questions, please call your gastroenterologist to clarify.  If you requested that your care partner not be given the details of your procedure findings, then the procedure report has been included in a sealed envelope for you to review at your convenience later.  YOU SHOULD EXPECT: Some feelings of bloating in the abdomen. Passage of more gas than usual.  Walking can help get rid of the air that was put into your GI tract during the procedure and reduce the bloating. If you had a lower endoscopy (such as a colonoscopy or flexible sigmoidoscopy) you may notice spotting of blood in your stool or on the toilet paper. If you underwent a bowel prep for your procedure, you may not have a normal bowel movement for a few days.  Please Note:  You might notice some irritation and congestion in your nose or some drainage.  This is from the oxygen used during your procedure.  There is no need for concern and it should clear up in a day or so.  SYMPTOMS TO REPORT IMMEDIATELY:   Following lower endoscopy (colonoscopy or flexible sigmoidoscopy):  Excessive amounts of blood in the stool  Significant tenderness or worsening of abdominal pains  Swelling of the abdomen that is new, acute  Fever of 100F or higher    For urgent or emergent issues, a gastroenterologist can be reached at any hour by calling [phone].   DIET:  We do recommend a small meal at first, but then you may proceed to your regular diet.  Drink plenty of fluids but you should avoid alcoholic beverages for 24 hours.  ACTIVITY:  You should plan to  take it easy for the rest of today and you should NOT DRIVE or use heavy machinery until tomorrow (because of the sedation medicines used during the test).    FOLLOW UP: Our staff will call the number listed on your records the next business day following your procedure to check on you and address any questions or concerns that you may have regarding the information given to you following your procedure. If we do not reach you, we will leave a message.  However, if you are feeling well and you are not experiencing any problems, there is no need to return our call.  We will assume that you have returned to your regular daily activities without incident.  If any biopsies were taken you will be contacted by phone or by letter within the next 1-3 weeks.  Please call us at [phone] if you have not heard about the biopsies in 3 weeks.    SIGNATURES/CONFIDENTIALITY: You and/or your care partner have signed paperwork which will be entered into your electronic medical record.  These signatures attest to the fact that that the information above on your After Visit Summary has been reviewed and is understood.  Full responsibility of the confidentiality of this discharge information lies with you and/or your care-partner.

## 2018-02-08 ENCOUNTER — Telehealth: Payer: Self-pay

## 2018-02-08 NOTE — Telephone Encounter (Signed)
  Follow up Call-  Call back number 02/07/2018  Post procedure Call Back phone  # [phone]  Permission to leave phone message Yes  Some recent data might be hidden     Patient questions:  Do you have a fever, pain , or abdominal swelling? No. Pain Score  0 *  Have you tolerated food without any problems? Yes.    Have you been able to return to your normal activities? Yes.    Do you have any questions about your discharge instructions: Diet   No. Medications  No. Follow up visit  No.  Do you have questions or concerns about your Care? No.  Actions: * If pain score is 4 or above: No action needed, pain <4.  No problems noted per pt. maw

## 2018-02-20 ENCOUNTER — Ambulatory Visit (INDEPENDENT_AMBULATORY_CARE_PROVIDER_SITE_OTHER): Payer: 59

## 2018-02-20 DIAGNOSIS — T63441D Toxic effect of venom of bees, accidental (unintentional), subsequent encounter: Secondary | ICD-10-CM

## 2018-03-15 ENCOUNTER — Telehealth: Payer: Self-pay | Admitting: Allergy and Immunology

## 2018-03-15 NOTE — Telephone Encounter (Signed)
Pt called and said that she has a sinus infection and earache . [facility] [phone]

## 2018-03-15 NOTE — Telephone Encounter (Signed)
Spoke to patient states she has had nasal congestion sinus problem since last Thursday 03/09/18. States she has been doing nasal lavage, loratadine, and rhinocort with some relief but woke up with earache appt made for 03/16/18 @ 2:30p with Dr Nelva Bush

## 2018-03-16 ENCOUNTER — Ambulatory Visit: Payer: 59 | Admitting: Allergy

## 2018-03-16 ENCOUNTER — Encounter: Payer: Self-pay | Admitting: Allergy

## 2018-03-16 VITALS — BP 140/78 | HR 78 | Resp 16 | Ht 63.5 in | Wt 137.0 lb

## 2018-03-16 DIAGNOSIS — T63441D Toxic effect of venom of bees, accidental (unintentional), subsequent encounter: Secondary | ICD-10-CM | POA: Diagnosis not present

## 2018-03-16 DIAGNOSIS — J3089 Other allergic rhinitis: Secondary | ICD-10-CM

## 2018-03-16 NOTE — Patient Instructions (Signed)
  1. Continue daily use of loratadine 10 mg daily  2. For nasal congestion increase OTC Rhinocort to 2 sprays each nostril daily for next 1-2 weeks then can return to regular dosing of 1 spray each nostril 3-7 times per week  3. Continue saline lavage 2-3 times a day right now with nasal congestion.  Use Rhinocort after lavage is performed.    4. Add in Mucinex 600mg  1 tab 1-2 times a day with plenty of water to help thin/loosen mucus  5. If needed:   A. EpiPen, Benadryl, M.D./ER evaluation for allergic reaction  6. Continue immunotherapy directed against Hymenoptera venom  7. Return to clinic in 12 months or earlier if problem

## 2018-03-16 NOTE — Progress Notes (Signed)
Follow-up Note  RE: Alisha [name] MRN: [id number] DOB: 13-Jun-1946 Date of Office Visit: 03/16/2018   History of present illness: Alisha [name] is a 72 y.o. female presenting today for sick visit.  She has a history of hymenoptera allergy and allergic rhinitis.  She was last seen in the office on 10/04/17 by Dr. Neldon Mc.  She states that about a week ago she had an eye infection due to a stye and was treated with 5 days of an oral antibiotic however she does not recall which antibiotic this was.  She completed the course.  She states after completion of the course she was out mowing the grass (she does use a mask) and states she developed a lot of nasal congestion.  The nasal congestion is not improving and she states she did have a right ear ache yesterday.  She has been using saline lavage 2-3 times a day which helps temporarily.  She has been using rhinocort 1 spray each nostril 3 times week.  She takes claritin daily.  She denies any fevers/night sweats/chills, sinus pressure or pain, cough/difficulty breathing. She is on venom immunotherapy and tolerating injections well without local or systemic reactions.   Review of systems: Review of Systems  Constitutional: Negative for chills, fever and malaise/fatigue.  HENT: Positive for congestion and ear pain. Negative for ear discharge, hearing loss, nosebleeds, sinus pain, sore throat and tinnitus.   Eyes: Negative for pain, discharge and redness.  Respiratory: Negative for cough, shortness of breath and wheezing.   Cardiovascular: Negative for chest pain.  Gastrointestinal: Negative for abdominal pain, constipation, diarrhea, heartburn, nausea and vomiting.  Musculoskeletal: Negative for joint pain.  Skin: Negative for itching and rash.  Neurological: Negative for headaches.    All other systems negative unless noted above in HPI  Past medical/social/surgical/family history have been reviewed and are unchanged unless  specifically indicated below.  No changes  Medication List: Allergies as of 03/16/2018   No Known Allergies     Medication List        Accurate as of 03/16/18  3:04 PM. Always use your most recent med list.          ALOE VERA PO Take 1 tablet by mouth daily.   CO Q 10 PO Take 1 tablet by mouth daily.   COLLAGEN PO Take by mouth daily. Marine Collagen   CRANBERRY PO Take by mouth daily.   EPINEPHrine 0.3 mg/0.3 mL Soaj injection Commonly known as:  EPI-PEN Use as directed for life-threatening allergic reaction.   EYE SUPPORT PO Take by mouth daily.   FIBER PO Take by mouth daily. Fiber Benefits   FLORA-Q PO Take 1 capsule by mouth daily.   GARLIC PO Take by mouth daily.   LEG VEIN & CIRCULATION PO Take by mouth daily.   loratadine 10 MG tablet Commonly known as:  CLARITIN Take 10 mg by mouth daily.   HEALTHY HAIR/SKIN/NAILS PO Take by mouth daily.   multivitamin tablet Take 1 tablet by mouth daily.   NATURAL FIBER PO Take by mouth. Fibermucil   NONFORMULARY OR COMPOUNDED ITEM Allergy Vaccine 1:10 Given at [place] Take by mouth. Cholestracare   NUTRITIONAL SUPPLEMENT PO Take by mouth. K2 (MK7) supplement   OMEGA 3 PO Take 1 capsule by mouth daily.   OVER THE COUNTER MEDICATION Phytoceramides   OVER THE COUNTER MEDICATION Take 1 capsule by mouth daily. Bone and body factors   RHINOCORT ALLERGY 32  MCG/ACT nasal spray Generic drug:  budesonide Place 1 spray into both nostrils daily.   VITAMIN C PO Take 1 tablet by mouth daily.       Known medication allergies: No Known Allergies   Physical examination: Blood pressure 140/78, pulse 78, resp. rate 16, height 5' 3.5" (1.613 m), weight 137 lb (62.1 kg), SpO2 98 %.  General: Alert, interactive, in no acute distress. HEENT: PERRLA, TMs pearly gray, turbinates moderately edematous with crusty discharge R>L, post-pharynx non erythematous. Neck: Supple without  lymphadenopathy. Lungs: Clear to auscultation without wheezing, rhonchi or rales. {no increased work of breathing. CV: Normal S1, S2 without murmurs. Abdomen: Nondistended, nontender. Skin: Warm and dry, without lesions or rashes. Extremities:  No clubbing, cyanosis or edema. Neuro:   Grossly intact.  Diagnositics/Labs: None today  Assessment and plan:   Allergic rhinitis - current symptoms of increased nasal congestion occurred following mowing the grass.  Advised to increase rhinocort use and will add in Mucinex to regimen right now and she will continue saline lavage.  Do not believe she has a bacterial infection and thus does not need additional antibiotics.  Toxic effect of venom of bees - continue immunotherapy  1. Continue daily use of loratadine 10 mg daily  2. For nasal congestion increase OTC Rhinocort to 2 sprays each nostril daily for next 1-2 weeks then can return to regular dosing of 1 spray each nostril 3-7 times per week  3. Continue saline lavage 2-3 times a day right now with nasal congestion.  Use Rhinocort after lavage is performed.    4. Add in Mucinex 600mg  1 tab 1-2 times a day with plenty of water to help thin/loosen mucus  5. If needed:   A. EpiPen, Benadryl, M.D./ER evaluation for allergic reaction  6. Continue immunotherapy directed against Hymenoptera venom  7. Return to clinic in 12 months or earlier if problem  I appreciate the opportunity to take part in Alisha [name]'s care. Please do not hesitate to contact me with questions.  Sincerely,   Prudy Feeler, MD Allergy/Immunology [facility]

## 2018-03-20 ENCOUNTER — Ambulatory Visit (INDEPENDENT_AMBULATORY_CARE_PROVIDER_SITE_OTHER): Payer: 59 | Admitting: *Deleted

## 2018-03-20 DIAGNOSIS — T63441D Toxic effect of venom of bees, accidental (unintentional), subsequent encounter: Secondary | ICD-10-CM

## 2018-04-17 ENCOUNTER — Ambulatory Visit (INDEPENDENT_AMBULATORY_CARE_PROVIDER_SITE_OTHER): Payer: 59 | Admitting: *Deleted

## 2018-04-17 DIAGNOSIS — T63441D Toxic effect of venom of bees, accidental (unintentional), subsequent encounter: Secondary | ICD-10-CM | POA: Diagnosis not present

## 2018-05-15 ENCOUNTER — Ambulatory Visit (INDEPENDENT_AMBULATORY_CARE_PROVIDER_SITE_OTHER): Payer: 59 | Admitting: *Deleted

## 2018-05-15 DIAGNOSIS — T63441D Toxic effect of venom of bees, accidental (unintentional), subsequent encounter: Secondary | ICD-10-CM | POA: Diagnosis not present

## 2018-06-12 ENCOUNTER — Ambulatory Visit (INDEPENDENT_AMBULATORY_CARE_PROVIDER_SITE_OTHER): Payer: 59 | Admitting: *Deleted

## 2018-06-12 DIAGNOSIS — T63441D Toxic effect of venom of bees, accidental (unintentional), subsequent encounter: Secondary | ICD-10-CM

## 2018-07-10 ENCOUNTER — Ambulatory Visit (INDEPENDENT_AMBULATORY_CARE_PROVIDER_SITE_OTHER): Payer: 59 | Admitting: *Deleted

## 2018-07-10 DIAGNOSIS — T63441D Toxic effect of venom of bees, accidental (unintentional), subsequent encounter: Secondary | ICD-10-CM

## 2018-08-07 ENCOUNTER — Ambulatory Visit (INDEPENDENT_AMBULATORY_CARE_PROVIDER_SITE_OTHER): Payer: 59 | Admitting: *Deleted

## 2018-08-07 DIAGNOSIS — T63441D Toxic effect of venom of bees, accidental (unintentional), subsequent encounter: Secondary | ICD-10-CM | POA: Diagnosis not present

## 2018-09-04 ENCOUNTER — Ambulatory Visit (INDEPENDENT_AMBULATORY_CARE_PROVIDER_SITE_OTHER): Payer: 59 | Admitting: *Deleted

## 2018-09-04 DIAGNOSIS — T63441D Toxic effect of venom of bees, accidental (unintentional), subsequent encounter: Secondary | ICD-10-CM | POA: Diagnosis not present

## 2018-10-02 ENCOUNTER — Ambulatory Visit (INDEPENDENT_AMBULATORY_CARE_PROVIDER_SITE_OTHER): Payer: 59 | Admitting: *Deleted

## 2018-10-02 DIAGNOSIS — T63441D Toxic effect of venom of bees, accidental (unintentional), subsequent encounter: Secondary | ICD-10-CM | POA: Diagnosis not present

## 2018-10-30 ENCOUNTER — Ambulatory Visit (INDEPENDENT_AMBULATORY_CARE_PROVIDER_SITE_OTHER): Payer: 59 | Admitting: *Deleted

## 2018-10-30 DIAGNOSIS — T63441D Toxic effect of venom of bees, accidental (unintentional), subsequent encounter: Secondary | ICD-10-CM

## 2018-11-15 ENCOUNTER — Other Ambulatory Visit: Payer: Self-pay | Admitting: Family Medicine

## 2018-11-15 DIAGNOSIS — R1310 Dysphagia, unspecified: Secondary | ICD-10-CM

## 2018-11-19 ENCOUNTER — Other Ambulatory Visit: Payer: PRIVATE HEALTH INSURANCE

## 2018-11-20 ENCOUNTER — Ambulatory Visit (INDEPENDENT_AMBULATORY_CARE_PROVIDER_SITE_OTHER): Payer: 59

## 2018-11-20 DIAGNOSIS — T63441D Toxic effect of venom of bees, accidental (unintentional), subsequent encounter: Secondary | ICD-10-CM

## 2018-11-21 ENCOUNTER — Other Ambulatory Visit: Payer: Self-pay

## 2018-11-21 ENCOUNTER — Encounter: Payer: Self-pay | Admitting: Allergy and Immunology

## 2018-11-21 ENCOUNTER — Ambulatory Visit: Payer: 59 | Admitting: Allergy and Immunology

## 2018-11-21 VITALS — BP 152/78 | HR 88 | Resp 18

## 2018-11-21 DIAGNOSIS — T63441D Toxic effect of venom of bees, accidental (unintentional), subsequent encounter: Secondary | ICD-10-CM

## 2018-11-21 DIAGNOSIS — K219 Gastro-esophageal reflux disease without esophagitis: Secondary | ICD-10-CM

## 2018-11-21 DIAGNOSIS — J3089 Other allergic rhinitis: Secondary | ICD-10-CM | POA: Diagnosis not present

## 2018-11-21 NOTE — Progress Notes (Signed)
[facility]   Follow-up Note  Referring Provider: Hayden Rasmussen, MD Primary Provider: Hayden Rasmussen, MD Date of Office Visit: 11/21/2018  Subjective:   Alisha [name] (DOB: 1945/07/16) is a 73 y.o. female who returns to the [facility] on 11/21/2018 in re-evaluation of the following:  HPI: Alisha [name] presents to this clinic in evaluation of her hymenoptera venom hypersensitivity state and allergic rhinitis.  She has not been seen in this clinic since 16 March 2018.  Immunotherapy appears to be going quite well at every 4 weeks without any adverse effect.  She apparently developed a problem with his blood pressure and she has been on various doses of losartan.  At this point she thinks her blood pressure is under pretty good control with her current dose of losartan.  About 10 days ago she started to develop some difficulty swallowing.  It was as though she had difficulty transitioning food from her mouth down into her esophagus and she would need to drink a lot of fluids to get that food down.  There was an episode where she actually felt as though she had a piece of chicken stuck in her throat for just a few seconds.  She was started on sucralfate by her primary care physician and she is actually better regarding this issue at this point.  She does drink coffee in the morning.  She did have a problem with reflux in the distant past but she has been treating this naturally with aloe vera and various other supplements.  This weekend she developed "sinus" which was nasal congestion and just feeling a little achy and blah.  Fortunately, this is better today.  She never had a fever and she never had any significant headache or ugly nasal discharge or other respiratory tract symptoms.  Allergies as of 11/21/2018   No Known Allergies     Medication List    NONFORMULARY OR COMPOUNDED ITEM Stopped by:   Kevan Rosebush, MD  Rhinocort Allergy 32 MCG/ACT nasal spray Generic drug:  budesonide Stopped by:   Kevan Rosebush, MD     TAKE these medications   ALOE VERA PO Take 1 tablet by mouth daily.   CO Q 10 PO Take 1 tablet by mouth daily.   COLLAGEN PO Take by mouth daily. Marine Collagen   CRANBERRY PO Take by mouth daily.   EPINEPHrine 0.3 mg/0.3 mL Soaj injection Commonly known as:  EPI-PEN Use as directed for life-threatening allergic reaction.   EYE SUPPORT PO Take by mouth daily.   FIBER PO Take by mouth daily. Fiber Benefits   FLORA-Q PO Take 1 capsule by mouth daily.   GARLIC PO Take by mouth daily.   LEG VEIN & CIRCULATION PO Take by mouth daily.   loratadine 10 MG tablet Commonly known as:  CLARITIN Take 10 mg by mouth daily.   losartan 50 MG tablet Commonly known as:  COZAAR Take 50 mg by mouth daily.   HEALTHY HAIR/SKIN/NAILS PO Take by mouth daily.   multivitamin tablet Take 1 tablet by mouth daily.   NATURAL FIBER PO Take by mouth. Fibermucil   NUTRITIONAL SUPPLEMENT PO Take by mouth. Cholestracare   NUTRITIONAL SUPPLEMENT PO Take by mouth. K2 (MK7) supplement   OMEGA 3 PO Take 1 capsule by mouth daily.   OVER THE COUNTER MEDICATION Phytoceramides   OVER THE COUNTER MEDICATION Take 1 capsule by mouth daily. Bone and body factors  sucralfate 1 g tablet Commonly known as:  CARAFATE Take 1 g by mouth 2 (two) times daily.   VITAMIN C PO Take 1 tablet by mouth daily.       Past Medical History:  Diagnosis Date  . ALLERGIC RHINITIS   . Hymenoptera allergy   . Hypertension   . Recurrent sinusitis     Past Surgical History:  Procedure Laterality Date  . COLONOSCOPY    . POLYPECTOMY      Review of systems negative except as noted in HPI / PMHx or noted below:  Review of Systems  Constitutional: Negative.   HENT: Negative.   Eyes: Negative.   Respiratory: Negative.   Cardiovascular: Negative.   Gastrointestinal: Negative.    Genitourinary: Negative.   Musculoskeletal: Negative.   Skin: Negative.   Neurological: Negative.   Endo/Heme/Allergies: Negative.   Psychiatric/Behavioral: Negative.      Objective:   Vitals:   11/21/18 1527  BP: (!) 152/78  Pulse: 88  Resp: 18          Physical Exam Constitutional:      Appearance: She is not diaphoretic.  HENT:     Head: Normocephalic.     Right Ear: Tympanic membrane, ear canal and external ear normal.     Left Ear: Tympanic membrane, ear canal and external ear normal.     Nose: Nose normal. No mucosal edema or rhinorrhea.     Mouth/Throat:     Pharynx: Uvula midline. No oropharyngeal exudate.  Eyes:     Conjunctiva/sclera: Conjunctivae normal.  Neck:     Thyroid: No thyromegaly.     Trachea: Trachea normal. No tracheal tenderness or tracheal deviation.  Cardiovascular:     Rate and Rhythm: Normal rate and regular rhythm.     Heart sounds: Normal heart sounds, S1 normal and S2 normal. No murmur.  Pulmonary:     Effort: No respiratory distress.     Breath sounds: Normal breath sounds. No stridor. No wheezing or rales.  Lymphadenopathy:     Head:     Right side of head: No tonsillar adenopathy.     Left side of head: No tonsillar adenopathy.     Cervical: No cervical adenopathy.  Skin:    Findings: No erythema or rash.     Nails: There is no clubbing.   Neurological:     Mental Status: She is alert.     Diagnostics: none  Assessment and Plan:   1. Toxic effect of venom of bees, unintentional, subsequent encounter   2. Other allergic rhinitis   3. LPRD (laryngopharyngeal reflux disease)     1. Continue to Treat inflammation with OTC Rhinocort 1 spray each nostril 3-7 times per week  2. Continue to treat reflux with sucralfate for one month.  Consolidate caffeine  3. If needed:   A. nasal saline spray  B. EpiPen, Benadryl, M.D./ER evaluation for allergic reaction  C. Loratadine 10 mg tablet daily  4. Continue immunotherapy  directed against Hymenoptera venom  5. Return to clinic in 12 months or earlier if problem  Alisha [name] may have had some type of viral infection recently and I do not see any need for altering her therapy to any significant degree and she will continue to use Rhinocort and loratadine and saline if needed.  She also appears to have redeveloped some issues with reflux and I have encouraged her to continue to use her sucralfate for at least a month and consolidate her caffeine and then she  can make a decision about further evaluation and treatment based upon her response to this approach.  She will continue on immunotherapy directed against hymenoptera venom.  I will see her back in this clinic in 12 weeks or earlier if there is a problem.  [name], MD Allergy / Immunology [place]

## 2018-11-21 NOTE — Patient Instructions (Addendum)
  1. Continue to Treat inflammation with OTC Rhinocort 1 spray each nostril 3-7 times per week  2. Continue to treat reflux with sucralfate for one month.  Consolidate caffeine  3. If needed:   A. nasal saline spray  B. EpiPen, Benadryl, M.D./ER evaluation for allergic reaction  C. Loratadine 10 mg tablet daily  4. Continue immunotherapy directed against Hymenoptera venom  5. Return to clinic in 12 months or earlier if problem

## 2018-11-22 ENCOUNTER — Encounter: Payer: Self-pay | Admitting: Allergy and Immunology

## 2018-12-18 ENCOUNTER — Ambulatory Visit (INDEPENDENT_AMBULATORY_CARE_PROVIDER_SITE_OTHER): Payer: 59 | Admitting: *Deleted

## 2018-12-18 DIAGNOSIS — T63441D Toxic effect of venom of bees, accidental (unintentional), subsequent encounter: Secondary | ICD-10-CM

## 2019-01-15 ENCOUNTER — Ambulatory Visit (INDEPENDENT_AMBULATORY_CARE_PROVIDER_SITE_OTHER): Payer: 59 | Admitting: *Deleted

## 2019-01-15 DIAGNOSIS — T63441D Toxic effect of venom of bees, accidental (unintentional), subsequent encounter: Secondary | ICD-10-CM | POA: Diagnosis not present

## 2019-02-12 ENCOUNTER — Other Ambulatory Visit: Payer: Self-pay

## 2019-02-12 ENCOUNTER — Ambulatory Visit (INDEPENDENT_AMBULATORY_CARE_PROVIDER_SITE_OTHER): Payer: 59 | Admitting: *Deleted

## 2019-02-12 DIAGNOSIS — T63441D Toxic effect of venom of bees, accidental (unintentional), subsequent encounter: Secondary | ICD-10-CM

## 2019-02-26 ENCOUNTER — Encounter: Payer: Self-pay | Admitting: Allergy and Immunology

## 2019-02-26 ENCOUNTER — Telehealth: Payer: Self-pay

## 2019-02-26 ENCOUNTER — Other Ambulatory Visit: Payer: Self-pay

## 2019-02-26 ENCOUNTER — Ambulatory Visit: Payer: 59 | Admitting: Allergy and Immunology

## 2019-02-26 VITALS — BP 136/70 | HR 78 | Temp 97.3°F | Resp 18

## 2019-02-26 DIAGNOSIS — J3089 Other allergic rhinitis: Secondary | ICD-10-CM

## 2019-02-26 DIAGNOSIS — H7291 Unspecified perforation of tympanic membrane, right ear: Secondary | ICD-10-CM

## 2019-02-26 DIAGNOSIS — T63441D Toxic effect of venom of bees, accidental (unintentional), subsequent encounter: Secondary | ICD-10-CM

## 2019-02-26 DIAGNOSIS — H6981 Other specified disorders of Eustachian tube, right ear: Secondary | ICD-10-CM | POA: Diagnosis not present

## 2019-02-26 MED ORDER — EPINEPHRINE 0.3 MG/0.3ML IJ SOAJ: 0.3000 mg | INTRAMUSCULAR | 1 refills | Status: DC | PRN

## 2019-02-26 NOTE — Patient Instructions (Addendum)
  1. Continue to Treat inflammation with OTC Rhinocort 1 spray each nostril 3-7 times per week  2. If needed:   A. nasal saline spray  B. EpiPen, Benadryl, M.D./ER evaluation for allergic reaction  C. Loratadine 10 mg tablet daily  2. Continue immunotherapy directed against Hymenoptera venom  3. Return to clinic in 12 months or earlier if problem  4.  Obtain fall flu vaccine (and COVID vaccine)  5. Evaluation with ENT?  Vertigo/tinnitus/hearing loss

## 2019-02-26 NOTE — Progress Notes (Signed)
[facility]   Follow-up Note  Referring Provider: Hayden Rasmussen, MD Primary Provider: Hayden Rasmussen, MD Date of Office Visit: 02/26/2019  Subjective:   Alisha [name] (DOB: [birth year]) is a 73 y.o. female who returns to the [facility] on 02/26/2019 in re-evaluation of the following:  HPI: Vickii Chafe presents to this clinic in evaluation of hymenoptera venom hypersensitivity state and allergic rhinitis and a recent problem with her right ear.  I last saw her in this clinic on 21 Nov 2018 and the plan was to have her continue on immunotherapy directed against hymenoptera venom and return to this clinic in 1 year.  Unfortunately, last night she woke up with intense right ear pain and she performed a pneumatic inflation technique several times as she was instructed to do so by Dr. Keturah Barre many years ago which resulted in resolution of her ear pain.  She did have vertigo for about 5 minutes at the initiation of this issue but she never had any associated hearing loss or tinnitus.  Once the pain was relieved after her pneumatic inflation technique the dizziness resolved.  When she woke up this morning she noticed blood from her right ear.  She did not really have any associated respiratory tract symptoms.  She did not have a recent cold or a fever or nasal stuffiness or headache.  She has not had problems with her ears in the past.  Allergies as of 02/26/2019   No Known Allergies     Medication List      ALOE VERA PO Take 1 tablet by mouth daily.   CO Q 10 PO Take 1 tablet by mouth daily.   COLLAGEN PO Take by mouth daily. Marine Collagen   CRANBERRY PO Take by mouth daily.   EPINEPHrine 0.3 mg/0.3 mL Soaj injection Commonly known as: EPI-PEN Inject 0.3 mLs (0.3 mg total) into the muscle as needed for anaphylaxis.   EYE SUPPORT PO Take by mouth daily.   FIBER PO Take by mouth daily. Fiber Benefits    FLORA-Q PO Take 1 capsule by mouth daily.   GARLIC PO Take by mouth daily.   LEG VEIN & CIRCULATION PO Take by mouth daily.   loratadine 10 MG tablet Commonly known as: CLARITIN Take 10 mg by mouth daily.   losartan 50 MG tablet Commonly known as: COZAAR Take 50 mg by mouth daily.   HEALTHY HAIR/SKIN/NAILS PO Take by mouth daily.   multivitamin tablet Take 1 tablet by mouth daily.   NATURAL FIBER PO Take by mouth. Fibermucil   NUTRITIONAL SUPPLEMENT PO Take by mouth. Cholestracare   NUTRITIONAL SUPPLEMENT PO Take by mouth. K2 (MK7) supplement   OMEGA 3 PO Take 1 capsule by mouth daily.   OVER THE COUNTER MEDICATION Phytoceramides   OVER THE COUNTER MEDICATION Take 1 capsule by mouth daily. Bone and body factors   sucralfate 1 g tablet Commonly known as: CARAFATE Take 1 g by mouth 2 (two) times daily.   VITAMIN C PO Take 1 tablet by mouth daily.       Past Medical History:  Diagnosis Date  . ALLERGIC RHINITIS   . Hymenoptera allergy   . Hypertension   . Recurrent sinusitis     Past Surgical History:  Procedure Laterality Date  . COLONOSCOPY    . POLYPECTOMY      Review of systems negative except as noted in HPI / PMHx or  noted below:  Review of Systems  Constitutional: Negative.   HENT: Negative.   Eyes: Negative.   Respiratory: Negative.   Cardiovascular: Negative.   Gastrointestinal: Negative.   Genitourinary: Negative.   Musculoskeletal: Negative.   Skin: Negative.   Neurological: Negative.   Endo/Heme/Allergies: Negative.   Psychiatric/Behavioral: Negative.      Objective:   Vitals:   02/26/19 1055  BP: 136/70  Pulse: 78  Resp: 18  Temp: (!) 97.3 F (36.3 C)  SpO2: 98%          Physical Exam Constitutional:      Appearance: She is not diaphoretic.  HENT:     Head: Normocephalic.     Right Ear: External ear normal. Right ear hemotympanum: Crusty blood occluding external auditory canal without any evidence of  fresh bleed.     Left Ear: Tympanic membrane, ear canal and external ear normal.     Nose: Nose normal. No mucosal edema or rhinorrhea.     Mouth/Throat:     Pharynx: Uvula midline. No oropharyngeal exudate.  Eyes:     Conjunctiva/sclera: Conjunctivae normal.  Neck:     Thyroid: No thyromegaly.     Trachea: Trachea normal. No tracheal tenderness or tracheal deviation.  Cardiovascular:     Rate and Rhythm: Normal rate and regular rhythm.     Heart sounds: Normal heart sounds, S1 normal and S2 normal. No murmur.  Pulmonary:     Effort: No respiratory distress.     Breath sounds: Normal breath sounds. No stridor. No wheezing or rales.  Lymphadenopathy:     Head:     Right side of head: No tonsillar adenopathy.     Left side of head: No tonsillar adenopathy.     Cervical: No cervical adenopathy.  Skin:    Findings: No erythema or rash.     Nails: There is no clubbing.   Neurological:     Mental Status: She is alert.     Diagnostics: none  Assessment and Plan:   1. Toxic effect of venom of bees, unintentional, subsequent encounter   2. Other allergic rhinitis   3. Tympanic membrane perforation, right   4. ETD (Eustachian tube dysfunction), right     1. Continue to Treat inflammation with OTC Rhinocort 1 spray each nostril 3-7 times per week  2. If needed:   A. nasal saline spray  B. EpiPen, Benadryl, M.D./ER evaluation for allergic reaction  C. Loratadine 10 mg tablet daily  2. Continue immunotherapy directed against Hymenoptera venom  3. Return to clinic in 12 months or earlier if problem  4.  Obtain fall flu vaccine (and COVID vaccine)  5. Evaluation with ENT?  Vertigo/tinnitus/hearing loss  It is not obvious why Peggy had ETD and perforation of her right tympanic membrane with bleed but obviously this did occur last night.  Fortunately, there is no hearing loss or tinnitus or vertigo.  We will approach this issue conservatively at this point and assume that over  the course of the next 10 days or so everything will heal up.  I did caution her about going swimming during this timeframe.  If for some reason she develops associated ear symptoms then we will refer her onto ENT.  All her other issues for which she is followed in this clinic are very stable and were not going to change any of her therapy.  We will see her back in this clinic for her prearranged annual visit in 2021 or earlier if there  is a problem.  [name], MD Allergy / Immunology [place]

## 2019-02-26 NOTE — Telephone Encounter (Signed)
Dr. Neldon Mc would like the patient to see ENT for vertigo/tinnitus/hearing loss.

## 2019-02-27 ENCOUNTER — Encounter: Payer: Self-pay | Admitting: Allergy and Immunology

## 2019-02-28 NOTE — Telephone Encounter (Signed)
Referral has been placed to [facility]. Their office will contact the patient. I will follow back up next week.   Thanks

## 2019-03-12 ENCOUNTER — Ambulatory Visit (INDEPENDENT_AMBULATORY_CARE_PROVIDER_SITE_OTHER): Payer: 59 | Admitting: *Deleted

## 2019-03-12 ENCOUNTER — Other Ambulatory Visit: Payer: Self-pay

## 2019-03-12 DIAGNOSIS — T63441D Toxic effect of venom of bees, accidental (unintentional), subsequent encounter: Secondary | ICD-10-CM | POA: Diagnosis not present

## 2019-04-09 ENCOUNTER — Ambulatory Visit: Payer: Self-pay | Admitting: *Deleted

## 2019-04-09 ENCOUNTER — Ambulatory Visit (INDEPENDENT_AMBULATORY_CARE_PROVIDER_SITE_OTHER): Payer: 59 | Admitting: *Deleted

## 2019-04-09 ENCOUNTER — Other Ambulatory Visit: Payer: Self-pay

## 2019-04-09 DIAGNOSIS — T63441D Toxic effect of venom of bees, accidental (unintentional), subsequent encounter: Secondary | ICD-10-CM

## 2019-05-07 ENCOUNTER — Ambulatory Visit (INDEPENDENT_AMBULATORY_CARE_PROVIDER_SITE_OTHER): Payer: 59

## 2019-05-07 ENCOUNTER — Other Ambulatory Visit: Payer: Self-pay

## 2019-05-07 DIAGNOSIS — T63441D Toxic effect of venom of bees, accidental (unintentional), subsequent encounter: Secondary | ICD-10-CM | POA: Diagnosis not present

## 2019-06-04 ENCOUNTER — Ambulatory Visit (INDEPENDENT_AMBULATORY_CARE_PROVIDER_SITE_OTHER): Payer: 59

## 2019-06-04 ENCOUNTER — Other Ambulatory Visit: Payer: Self-pay

## 2019-06-04 DIAGNOSIS — T63441D Toxic effect of venom of bees, accidental (unintentional), subsequent encounter: Secondary | ICD-10-CM

## 2019-07-02 ENCOUNTER — Other Ambulatory Visit: Payer: Self-pay

## 2019-07-02 ENCOUNTER — Ambulatory Visit (INDEPENDENT_AMBULATORY_CARE_PROVIDER_SITE_OTHER): Payer: 59

## 2019-07-02 DIAGNOSIS — T63441D Toxic effect of venom of bees, accidental (unintentional), subsequent encounter: Secondary | ICD-10-CM

## 2019-07-30 ENCOUNTER — Other Ambulatory Visit: Payer: Self-pay

## 2019-07-30 ENCOUNTER — Ambulatory Visit (INDEPENDENT_AMBULATORY_CARE_PROVIDER_SITE_OTHER): Payer: 59

## 2019-07-30 DIAGNOSIS — T63441D Toxic effect of venom of bees, accidental (unintentional), subsequent encounter: Secondary | ICD-10-CM | POA: Diagnosis not present

## 2019-08-27 ENCOUNTER — Ambulatory Visit (INDEPENDENT_AMBULATORY_CARE_PROVIDER_SITE_OTHER): Payer: 59

## 2019-08-27 DIAGNOSIS — T63441D Toxic effect of venom of bees, accidental (unintentional), subsequent encounter: Secondary | ICD-10-CM

## 2019-09-24 ENCOUNTER — Ambulatory Visit (INDEPENDENT_AMBULATORY_CARE_PROVIDER_SITE_OTHER): Payer: 59

## 2019-09-24 ENCOUNTER — Other Ambulatory Visit: Payer: Self-pay

## 2019-09-24 ENCOUNTER — Ambulatory Visit: Payer: Self-pay

## 2019-09-24 DIAGNOSIS — T63441D Toxic effect of venom of bees, accidental (unintentional), subsequent encounter: Secondary | ICD-10-CM

## 2019-10-22 ENCOUNTER — Ambulatory Visit (INDEPENDENT_AMBULATORY_CARE_PROVIDER_SITE_OTHER): Payer: 59

## 2019-10-22 ENCOUNTER — Other Ambulatory Visit: Payer: Self-pay

## 2019-10-22 DIAGNOSIS — T63441D Toxic effect of venom of bees, accidental (unintentional), subsequent encounter: Secondary | ICD-10-CM | POA: Diagnosis not present

## 2019-11-19 ENCOUNTER — Other Ambulatory Visit: Payer: Self-pay

## 2019-11-19 ENCOUNTER — Ambulatory Visit (INDEPENDENT_AMBULATORY_CARE_PROVIDER_SITE_OTHER): Payer: 59

## 2019-11-19 DIAGNOSIS — T63441D Toxic effect of venom of bees, accidental (unintentional), subsequent encounter: Secondary | ICD-10-CM

## 2019-12-17 ENCOUNTER — Other Ambulatory Visit: Payer: Self-pay

## 2019-12-17 ENCOUNTER — Ambulatory Visit (INDEPENDENT_AMBULATORY_CARE_PROVIDER_SITE_OTHER): Payer: 59

## 2019-12-17 DIAGNOSIS — T63441D Toxic effect of venom of bees, accidental (unintentional), subsequent encounter: Secondary | ICD-10-CM

## 2020-01-28 ENCOUNTER — Ambulatory Visit (INDEPENDENT_AMBULATORY_CARE_PROVIDER_SITE_OTHER): Payer: 59

## 2020-01-28 ENCOUNTER — Other Ambulatory Visit: Payer: Self-pay

## 2020-01-28 DIAGNOSIS — T63441D Toxic effect of venom of bees, accidental (unintentional), subsequent encounter: Secondary | ICD-10-CM | POA: Diagnosis not present

## 2020-03-10 ENCOUNTER — Ambulatory Visit (INDEPENDENT_AMBULATORY_CARE_PROVIDER_SITE_OTHER): Payer: 59

## 2020-03-10 DIAGNOSIS — T63441D Toxic effect of venom of bees, accidental (unintentional), subsequent encounter: Secondary | ICD-10-CM

## 2020-04-21 ENCOUNTER — Ambulatory Visit (INDEPENDENT_AMBULATORY_CARE_PROVIDER_SITE_OTHER): Payer: 59

## 2020-04-21 ENCOUNTER — Other Ambulatory Visit: Payer: Self-pay

## 2020-04-21 DIAGNOSIS — T63441D Toxic effect of venom of bees, accidental (unintentional), subsequent encounter: Secondary | ICD-10-CM

## 2020-06-02 ENCOUNTER — Other Ambulatory Visit: Payer: Self-pay

## 2020-06-02 ENCOUNTER — Ambulatory Visit (INDEPENDENT_AMBULATORY_CARE_PROVIDER_SITE_OTHER): Payer: 59 | Admitting: *Deleted

## 2020-06-02 DIAGNOSIS — T63441D Toxic effect of venom of bees, accidental (unintentional), subsequent encounter: Secondary | ICD-10-CM | POA: Diagnosis not present

## 2020-07-14 ENCOUNTER — Ambulatory Visit (INDEPENDENT_AMBULATORY_CARE_PROVIDER_SITE_OTHER): Payer: 59

## 2020-07-14 ENCOUNTER — Other Ambulatory Visit: Payer: Self-pay

## 2020-07-14 DIAGNOSIS — T63441D Toxic effect of venom of bees, accidental (unintentional), subsequent encounter: Secondary | ICD-10-CM | POA: Diagnosis not present

## 2020-08-25 ENCOUNTER — Ambulatory Visit (INDEPENDENT_AMBULATORY_CARE_PROVIDER_SITE_OTHER): Payer: 59 | Admitting: *Deleted

## 2020-08-25 ENCOUNTER — Other Ambulatory Visit: Payer: Self-pay

## 2020-08-25 ENCOUNTER — Telehealth: Payer: Self-pay | Admitting: Allergy and Immunology

## 2020-08-25 DIAGNOSIS — T63441D Toxic effect of venom of bees, accidental (unintentional), subsequent encounter: Secondary | ICD-10-CM | POA: Diagnosis not present

## 2020-08-25 NOTE — Telephone Encounter (Signed)
Pt came into office and states she was recommended to use rhinocort but she cannot find it anywhere cheaper than $70. Pt would like an alternative.  Please advise.

## 2020-08-25 NOTE — Telephone Encounter (Signed)
Rhinocort / budesonide, Nasacort / triamcinolone - all would be good choices.

## 2020-08-25 NOTE — Telephone Encounter (Signed)
Dr Neldon Mc please advise? Could she try Nasacort OTC?

## 2020-08-25 NOTE — Telephone Encounter (Signed)
Pt informed of this and will purchase nasacort and try it

## 2020-10-06 ENCOUNTER — Other Ambulatory Visit: Payer: Self-pay

## 2020-10-06 ENCOUNTER — Ambulatory Visit (INDEPENDENT_AMBULATORY_CARE_PROVIDER_SITE_OTHER): Payer: 59 | Admitting: *Deleted

## 2020-10-06 DIAGNOSIS — T63441D Toxic effect of venom of bees, accidental (unintentional), subsequent encounter: Secondary | ICD-10-CM

## 2020-11-17 ENCOUNTER — Other Ambulatory Visit: Payer: Self-pay

## 2020-11-17 ENCOUNTER — Ambulatory Visit (INDEPENDENT_AMBULATORY_CARE_PROVIDER_SITE_OTHER): Payer: 59 | Admitting: *Deleted

## 2020-11-17 DIAGNOSIS — T63441D Toxic effect of venom of bees, accidental (unintentional), subsequent encounter: Secondary | ICD-10-CM | POA: Diagnosis not present

## 2020-12-29 ENCOUNTER — Other Ambulatory Visit: Payer: Self-pay

## 2020-12-29 ENCOUNTER — Ambulatory Visit (INDEPENDENT_AMBULATORY_CARE_PROVIDER_SITE_OTHER): Payer: 59 | Admitting: *Deleted

## 2020-12-29 DIAGNOSIS — T63441D Toxic effect of venom of bees, accidental (unintentional), subsequent encounter: Secondary | ICD-10-CM

## 2021-02-09 ENCOUNTER — Other Ambulatory Visit: Payer: Self-pay

## 2021-02-09 ENCOUNTER — Ambulatory Visit (INDEPENDENT_AMBULATORY_CARE_PROVIDER_SITE_OTHER): Payer: 59

## 2021-02-09 DIAGNOSIS — T63441D Toxic effect of venom of bees, accidental (unintentional), subsequent encounter: Secondary | ICD-10-CM | POA: Diagnosis not present

## 2021-03-23 ENCOUNTER — Ambulatory Visit (INDEPENDENT_AMBULATORY_CARE_PROVIDER_SITE_OTHER): Payer: 59 | Admitting: *Deleted

## 2021-03-23 ENCOUNTER — Other Ambulatory Visit: Payer: Self-pay

## 2021-03-23 DIAGNOSIS — T63441D Toxic effect of venom of bees, accidental (unintentional), subsequent encounter: Secondary | ICD-10-CM

## 2021-05-04 ENCOUNTER — Other Ambulatory Visit: Payer: Self-pay

## 2021-05-04 ENCOUNTER — Ambulatory Visit (INDEPENDENT_AMBULATORY_CARE_PROVIDER_SITE_OTHER): Payer: 59 | Admitting: *Deleted

## 2021-05-04 DIAGNOSIS — T63441D Toxic effect of venom of bees, accidental (unintentional), subsequent encounter: Secondary | ICD-10-CM

## 2021-06-15 ENCOUNTER — Other Ambulatory Visit: Payer: Self-pay

## 2021-06-15 ENCOUNTER — Ambulatory Visit (INDEPENDENT_AMBULATORY_CARE_PROVIDER_SITE_OTHER): Payer: 59

## 2021-06-15 DIAGNOSIS — T63441D Toxic effect of venom of bees, accidental (unintentional), subsequent encounter: Secondary | ICD-10-CM | POA: Diagnosis not present

## 2021-07-27 ENCOUNTER — Ambulatory Visit (INDEPENDENT_AMBULATORY_CARE_PROVIDER_SITE_OTHER): Payer: 59

## 2021-07-27 ENCOUNTER — Other Ambulatory Visit: Payer: Self-pay

## 2021-07-27 DIAGNOSIS — T63441D Toxic effect of venom of bees, accidental (unintentional), subsequent encounter: Secondary | ICD-10-CM

## 2021-08-03 ENCOUNTER — Other Ambulatory Visit: Payer: Self-pay

## 2021-08-03 ENCOUNTER — Encounter: Payer: Self-pay | Admitting: Allergy and Immunology

## 2021-08-03 ENCOUNTER — Ambulatory Visit: Payer: 59 | Admitting: Allergy and Immunology

## 2021-08-03 VITALS — BP 150/66 | HR 86 | Temp 97.2°F | Resp 16 | Ht 63.0 in | Wt 139.2 lb

## 2021-08-03 DIAGNOSIS — T63481D Toxic effect of venom of other arthropod, accidental (unintentional), subsequent encounter: Secondary | ICD-10-CM

## 2021-08-03 DIAGNOSIS — T63481A Toxic effect of venom of other arthropod, accidental (unintentional), initial encounter: Secondary | ICD-10-CM

## 2021-08-03 MED ORDER — EPINEPHRINE 0.3 MG/0.3ML IJ SOAJ: 0.3000 mg | INTRAMUSCULAR | 1 refills | Status: DC | PRN

## 2021-08-03 NOTE — Progress Notes (Signed)
[facility]   Follow-up Note  Referring Provider: Hayden Rasmussen, MD Primary Provider: Hayden Rasmussen, MD Date of Office Visit: 08/03/2021  Subjective:   Alisha [name] (DOB: Aug 24, 1945) is a 76 y.o. female who returns to the [facility] on 08/03/2021 in re-evaluation of the following:  HPI: Alisha [name] returns to this clinic in evaluation of hymenoptera venom hypersensitivity state and history of allergic rhinitis.  Her last visit to this clinic was 25 February 2021.  She continues to use immunotherapy currently at every 6 weeks without any adverse effect.  She was stung by a wasp in the summer 2022 without any adverse effect at all other than some very slight sting site itchiness.  She has had 3 COVID vaccines and she has received this year's flu vaccine.  Allergies as of 08/03/2021   No Known Allergies      Medication List    ALOE VERA PO Take 1 tablet by mouth daily.   CO Q 10 PO Take 1 tablet by mouth daily.   COLLAGEN PO Take by mouth daily. Marine Collagen   CRANBERRY PO Take by mouth daily.   EPINEPHrine 0.3 mg/0.3 mL Soaj injection Commonly known as: EPI-PEN Inject 0.3 mg into the muscle as needed for anaphylaxis.   EYE SUPPORT PO Take by mouth daily.   FIBER PO Take by mouth daily. Fiber Benefits   FLORA-Q PO Take 1 capsule by mouth daily.   GARLIC PO Take by mouth daily.   LEG VEIN & CIRCULATION PO Take by mouth daily.   losartan 50 MG tablet Commonly known as: COZAAR Take 50 mg by mouth daily.   HEALTHY HAIR/SKIN/NAILS PO Take by mouth daily.   multivitamin tablet Take 1 tablet by mouth daily.   NATURAL FIBER PO Take by mouth. Fibermucil   NUTRITIONAL SUPPLEMENT PO Take by mouth. Cholestracare   NUTRITIONAL SUPPLEMENT PO Take by mouth. K2 (MK7) supplement   OMEGA 3 PO Take 1 capsule by mouth daily.   OVER THE COUNTER MEDICATION Phytoceramides   OVER THE  COUNTER MEDICATION Take 1 capsule by mouth daily. Bone and body factors   sucralfate 1 g tablet Commonly known as: CARAFATE Take 1 g by mouth 2 (two) times daily.   VITAMIN C PO Take 1 tablet by mouth daily.        Past Medical History:  Diagnosis Date   ALLERGIC RHINITIS    Hymenoptera allergy    Hypertension    Recurrent sinusitis     Past Surgical History:  Procedure Laterality Date   COLONOSCOPY     POLYPECTOMY      Review of systems negative except as noted in HPI / PMHx or noted below:  Review of Systems  Constitutional: Negative.   HENT: Negative.    Eyes: Negative.   Respiratory: Negative.    Cardiovascular: Negative.   Gastrointestinal: Negative.   Genitourinary: Negative.   Musculoskeletal: Negative.   Skin: Negative.   Neurological: Negative.   Endo/Heme/Allergies: Negative.   Psychiatric/Behavioral: Negative.      Objective:   Vitals:   08/03/21 1146  BP: (!) 150/66  Pulse: 86  Resp: 16  Temp: (!) 97.2 F (36.2 C)  SpO2: 98%   Height: 5\' 3"  (160 cm)  Weight: 139 lb 3.2 oz (63.1 kg)   Physical Exam Constitutional:      Appearance: She is not diaphoretic.  HENT:     Head: Normocephalic.  Right Ear: Tympanic membrane, ear canal and external ear normal.     Left Ear: Tympanic membrane, ear canal and external ear normal.     Nose: Nose normal. No mucosal edema or rhinorrhea.     Mouth/Throat:     Pharynx: Uvula midline. No oropharyngeal exudate.  Eyes:     Conjunctiva/sclera: Conjunctivae normal.  Neck:     Thyroid: No thyromegaly.     Trachea: Trachea normal. No tracheal tenderness or tracheal deviation.  Cardiovascular:     Rate and Rhythm: Normal rate and regular rhythm.     Heart sounds: Normal heart sounds, S1 normal and S2 normal. No murmur heard. Pulmonary:     Effort: No respiratory distress.     Breath sounds: Normal breath sounds. No stridor. No wheezing or rales.  Lymphadenopathy:     Head:     Right side of head:  No tonsillar adenopathy.     Left side of head: No tonsillar adenopathy.     Cervical: No cervical adenopathy.  Skin:    Findings: No erythema or rash.     Nails: There is no clubbing.  Neurological:     Mental Status: She is alert.    Diagnostics: none  Assessment and Plan:   1. 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  Continue immunotherapy directed against hymenoptera venom  2.  Continue EpiPen if needed  3.  Arrange for venom clinic evaluation  Alisha [name] has had 5 years of immunotherapy and we will now arrange for her to undergo venom clinic evaluation to see if continued immunotherapy is required.  Her initial reaction did not appear to be associated with multiorgan anaphylaxis suggesting that there may be an opportunity for her to discontinue her immunotherapy pending the results of her venom clinic evaluation.  Alisha Katz, MD Allergy / Immunology [place]

## 2021-08-04 ENCOUNTER — Encounter: Payer: Self-pay | Admitting: Allergy and Immunology

## 2021-08-04 ENCOUNTER — Telehealth: Payer: Self-pay | Admitting: *Deleted

## 2021-08-04 NOTE — Telephone Encounter (Signed)
Called and spoke with the patient and informed of Dr. Bruna Potter recommendation. I advised her that we would have her put on the venom testing list and she would be called when ready to schedule. Patient verbalized understanding.

## 2021-08-04 NOTE — Patient Instructions (Addendum)
°  1.  Continue immunotherapy directed against hymenoptera venom  2.  Continue EpiPen if needed  3.  Arrange for venom clinic evaluation

## 2021-08-04 NOTE — Telephone Encounter (Signed)
-----   Message from Jiles Prows, MD sent at 08/04/2021  6:47 AM EST ----- Please inform Peggy that she has had at least 5 years of immunotherapy and we will need to have her undergo evaluation at venom clinic to see if she still requires additional immunotherapy.  Please arrange for her to be evaluated in venom clinic.

## 2021-08-05 NOTE — Telephone Encounter (Signed)
Patient was put on the list in [facility] to be called when a date opens up. Thanks

## 2021-09-07 ENCOUNTER — Ambulatory Visit: Payer: 59

## 2021-09-14 ENCOUNTER — Ambulatory Visit (INDEPENDENT_AMBULATORY_CARE_PROVIDER_SITE_OTHER): Payer: 59

## 2021-09-14 ENCOUNTER — Other Ambulatory Visit: Payer: Self-pay

## 2021-09-14 DIAGNOSIS — T63441D Toxic effect of venom of bees, accidental (unintentional), subsequent encounter: Secondary | ICD-10-CM | POA: Diagnosis not present

## 2021-10-05 ENCOUNTER — Encounter: Payer: Self-pay | Admitting: Family

## 2021-10-05 ENCOUNTER — Ambulatory Visit: Payer: 59 | Admitting: Family

## 2021-10-05 ENCOUNTER — Other Ambulatory Visit: Payer: Self-pay

## 2021-10-05 VITALS — BP 142/82 | HR 80 | Temp 97.9°F | Resp 22

## 2021-10-05 DIAGNOSIS — T63441A Toxic effect of venom of bees, accidental (unintentional), initial encounter: Secondary | ICD-10-CM | POA: Diagnosis not present

## 2021-10-05 DIAGNOSIS — T63441D Toxic effect of venom of bees, accidental (unintentional), subsequent encounter: Secondary | ICD-10-CM

## 2021-10-05 NOTE — Patient Instructions (Addendum)
Allergic rhinitis ?Stop Nasacort for the next 5-7 days due to irritation in right nostril. When you start back using Nasacort use the technique we discussed in the office today. In the right nostril, point the applicator out toward the right ear. In the left nostril, point the applicator out toward the left ear ? ?Toxic effect of venom ?Recommend continuing venom injections directed towards yellow hornet (mixed vespid) and have access to your epinephrine auto injector device. I will update Dr. Neldon Mc on your results. ? ?Please let us know if this treatment plan is not working well for you. ?Schedule a follow up appointment in 6 months or sooner if needed ?

## 2021-10-05 NOTE — Progress Notes (Signed)
? ?[place] [address] ?Dept: [phone] ? ?FOLLOW UP NOTE ? ?Patient ID: Alisha [name], female    DOB: [birth year]  Age: 76 y.o. MRN: [id number] ?Date of Office Visit: 10/05/2021 ? ?Assessment  ?Chief Complaint: Allergy Testing (venom) ? ?HPI ?Alisha [name] is a 76 year old female who presents today for venom skin testing.  She was last seen on August 03, 2021 by Dr. Neldon Mc for systemic reaction to hymenoptera string and allergic rhinitis.  She denies any new diagnosis or surgeries since her last office visit. ? ?She has been receiving hymenoptera injections since September 20, 2016 and is here to see if she is able to stop these injections.  She reports that with her initial reaction she had a rash all over and her hands were red. She also had possible flushing of the face She did take children's Benadryl after being stung.  She denies any concomitant cardiorespiratory and gastrointestinal symptoms she has not received any insect stings since her last office visit.  She has been off all antihistamines for the past 3 days all questions answered and informed consent signed. ? ? ?Drug Allergies:  ?No Known Allergies ? ?Review of Systems: ?Review of Systems  ?Constitutional:  Negative for chills and fever.  ?HENT:    ?     Reports a little bit of nasal congestion and denies rhinorrhea and postnasal drip  ?Eyes:   ?     Denies itchy watery eyes  ?Respiratory:  Negative for cough, shortness of breath and wheezing.   ?Cardiovascular:  Negative for chest pain and palpitations.  ?Gastrointestinal:  Negative for abdominal pain, diarrhea, nausea and vomiting.  ?Genitourinary:  Negative for frequency.  ?Skin:  Negative for itching and rash.  ?Neurological:  Negative for headaches.  ? ? ?Physical Exam: ?BP (!) 142/82   Pulse 80   Temp 97.9 ?F (36.6 ?C) (Temporal)   Resp (!) 22   SpO2 100%   ? ?Physical Exam ?Constitutional:   ?   Appearance: Normal appearance.  ?HENT:  ?   Head: Normocephalic  and atraumatic.  ?   Comments: Pharynx normal, eyes normal, ears normal, nose: Bilateral lower turbinates mildly edematous with no drainage noted.  Irritation with scabbing noted in right nostril. ?   Right Ear: Tympanic membrane, ear canal and external ear normal.  ?   Left Ear: Tympanic membrane, ear canal and external ear normal.  ?   Mouth/Throat:  ?   Mouth: Mucous membranes are moist.  ?   Pharynx: Oropharynx is clear.  ?Eyes:  ?   Conjunctiva/sclera: Conjunctivae normal.  ?Cardiovascular:  ?   Rate and Rhythm: Regular rhythm.  ?   Heart sounds: Normal heart sounds.  ?Pulmonary:  ?   Effort: Pulmonary effort is normal.  ?   Breath sounds: Normal breath sounds.  ?   Comments: Lungs clear to auscultation ?Musculoskeletal:  ?   Cervical back: Neck supple.  ?Skin: ?   General: Skin is warm.  ?Neurological:  ?   Mental Status: She is alert and oriented to person, place, and time.  ?Psychiatric:     ?   Mood and Affect: Mood normal.     ?   Behavior: Behavior normal.     ?   Thought Content: Thought content normal.     ?   Judgment: Judgment normal.  ? ? ?Diagnostics: ?  ? Venom Testing - 10/05/21 1200   ? ? Time Antigen Placed 5176   ?  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 Lot # [id number] [employer]- [id number] [facility]- [id number] W- [id number]   ? Location Arm   ? Control Negative   Puncture  ? Histamine 2+   ? Yellow Jacket Negative   1.0 Puncture  ? Yellow Hornet Negative   1.0 Puncture  ? White Hornet Negative   1.0 Puncture  ? Wasp Negative   1.0 Puncture  ? Control Negative   ? Yellow Jacket Negative   0.001 Intradermal  ? Yellow Hornet Negative   0.001 Intradermal  ? White Hornet Negative   0.001 Intradermal  ? Wasp Negative   0.001 Intradermal  ? Yellow Jacket Negative   0.01 Intradermal  ? Yellow Hornet Negative   0.01 Intradermal  ? White Hornet Negative   0.01 Intradermal  ? Wasp Negative   0.01 Intradermal  ? Yellow Jacket Negative   0.1 Intradermal  ? Yellow Hornet Negative   0.1 Intradermal  ? White  Hornet Negative   0.1 Intradermal  ? Wasp Negative   0.1 Intradermal  ? Yellow Jacket Negative   1.0 Intradermal  ? Yellow Hornet --   8 x 7 1.0 Intradermal  ? White Hornet Negative   1.0 Intradermal  ? Wasp Negative   1.0 Intradermal  ? ?  ?  ? ?  ? ? ? ?Assessment and Plan: ?1. Toxic effect of venom of bees, unintentional, subsequent encounter   ? ? ?No orders of the defined types were placed in this encounter. ? ? ?Patient Instructions  ?Allergic rhinitis ?Stop Nasacort for the next 5-7 days due to irritation in right nostril. When you start back using Nasacort use the technique we discussed in the office today. In the right nostril, point the applicator out toward the right ear. In the left nostril, point the applicator out toward the left ear ? ?Toxic effect of venom ?Recommend continuing venom injections directed towards yellow hornet (mixed vespid) and have access to your epinephrine auto injector device. I will update Dr. Neldon Mc on your results. ? ?Please let us know if this treatment plan is not working well for you. ?Schedule a follow up appointment in 6 months or sooner if needed ? ?Return in about 6 months (around 04/07/2022), or if symptoms worsen or fail to improve. ?  ? ?Thank you for the opportunity to care for this patient.  Please do not hesitate to contact me with questions. ? ?Althea Charon, FNP ?[facility] ? ? ? ? ?

## 2021-10-26 ENCOUNTER — Ambulatory Visit (INDEPENDENT_AMBULATORY_CARE_PROVIDER_SITE_OTHER): Payer: 59

## 2021-10-26 DIAGNOSIS — T63441D Toxic effect of venom of bees, accidental (unintentional), subsequent encounter: Secondary | ICD-10-CM

## 2021-11-17 ENCOUNTER — Telehealth: Payer: Self-pay

## 2021-11-17 NOTE — Telephone Encounter (Signed)
Called and spoke to patient. Let her know what Dr. Neldon Mc recommended. Advised patient that we will need to sign the stop injection form for the wasp injections the next time she is in the office per Dr. Bruna Potter note. Patient stated she understood and would do so the next time she is in office in about 2 weeks from now. ?

## 2021-11-17 NOTE — Telephone Encounter (Signed)
Patient called about her results from her venom test. She stated that she did not hear from anyone telling her what the results were. Per AVS and venom test it states that she is allergic still to [employer]. Patient states concern about still getting her Wasp injections if she is no longer positive to Wasp. Please advise. ?

## 2021-11-17 NOTE — Telephone Encounter (Signed)
Dr. Neldon Mc,  ?Woud you like for Alisha [name] to continue her wasp injections since her skin testing on 10/05/21 was negative.  ? ?Please tell Alisha [name] I am sorry that we have not gotten back to her. ? ?Althea Charon, FNP

## 2021-11-17 NOTE — Telephone Encounter (Signed)
Attempted to contact Alisha [name] and left a message to return call. When she returns the call please let her know about Dr. Bruna Potter recommendations.

## 2021-11-18 NOTE — Telephone Encounter (Signed)
Thank you :)

## 2021-12-07 ENCOUNTER — Ambulatory Visit (INDEPENDENT_AMBULATORY_CARE_PROVIDER_SITE_OTHER): Payer: 59

## 2021-12-07 DIAGNOSIS — T63441D Toxic effect of venom of bees, accidental (unintentional), subsequent encounter: Secondary | ICD-10-CM

## 2022-01-18 ENCOUNTER — Ambulatory Visit (INDEPENDENT_AMBULATORY_CARE_PROVIDER_SITE_OTHER): Payer: 59

## 2022-01-18 DIAGNOSIS — T63441D Toxic effect of venom of bees, accidental (unintentional), subsequent encounter: Secondary | ICD-10-CM | POA: Diagnosis not present

## 2022-03-22 ENCOUNTER — Ambulatory Visit (INDEPENDENT_AMBULATORY_CARE_PROVIDER_SITE_OTHER): Payer: 59 | Admitting: *Deleted

## 2022-03-22 DIAGNOSIS — T63441D Toxic effect of venom of bees, accidental (unintentional), subsequent encounter: Secondary | ICD-10-CM

## 2022-05-17 ENCOUNTER — Ambulatory Visit (INDEPENDENT_AMBULATORY_CARE_PROVIDER_SITE_OTHER): Payer: 59

## 2022-05-17 DIAGNOSIS — T63441D Toxic effect of venom of bees, accidental (unintentional), subsequent encounter: Secondary | ICD-10-CM

## 2022-07-12 ENCOUNTER — Ambulatory Visit (INDEPENDENT_AMBULATORY_CARE_PROVIDER_SITE_OTHER): Payer: 59

## 2022-07-12 DIAGNOSIS — T63441D Toxic effect of venom of bees, accidental (unintentional), subsequent encounter: Secondary | ICD-10-CM | POA: Diagnosis not present

## 2022-09-06 ENCOUNTER — Ambulatory Visit (INDEPENDENT_AMBULATORY_CARE_PROVIDER_SITE_OTHER): Payer: 59

## 2022-09-06 DIAGNOSIS — T63441D Toxic effect of venom of bees, accidental (unintentional), subsequent encounter: Secondary | ICD-10-CM | POA: Diagnosis not present

## 2022-11-01 ENCOUNTER — Ambulatory Visit (INDEPENDENT_AMBULATORY_CARE_PROVIDER_SITE_OTHER): Payer: 59

## 2022-11-01 DIAGNOSIS — T63441D Toxic effect of venom of bees, accidental (unintentional), subsequent encounter: Secondary | ICD-10-CM | POA: Diagnosis not present

## 2022-12-27 ENCOUNTER — Ambulatory Visit (INDEPENDENT_AMBULATORY_CARE_PROVIDER_SITE_OTHER): Payer: 59 | Admitting: *Deleted

## 2022-12-27 DIAGNOSIS — T63441D Toxic effect of venom of bees, accidental (unintentional), subsequent encounter: Secondary | ICD-10-CM

## 2023-02-21 ENCOUNTER — Ambulatory Visit (INDEPENDENT_AMBULATORY_CARE_PROVIDER_SITE_OTHER): Payer: 59 | Admitting: *Deleted

## 2023-02-21 DIAGNOSIS — T63441D Toxic effect of venom of bees, accidental (unintentional), subsequent encounter: Secondary | ICD-10-CM

## 2023-04-18 ENCOUNTER — Ambulatory Visit: Payer: 59

## 2023-05-02 ENCOUNTER — Ambulatory Visit (INDEPENDENT_AMBULATORY_CARE_PROVIDER_SITE_OTHER): Payer: 59 | Admitting: *Deleted

## 2023-05-02 DIAGNOSIS — T63441D Toxic effect of venom of bees, accidental (unintentional), subsequent encounter: Secondary | ICD-10-CM

## 2023-06-27 ENCOUNTER — Ambulatory Visit (INDEPENDENT_AMBULATORY_CARE_PROVIDER_SITE_OTHER): Payer: 59 | Admitting: *Deleted

## 2023-06-27 DIAGNOSIS — T63441D Toxic effect of venom of bees, accidental (unintentional), subsequent encounter: Secondary | ICD-10-CM

## 2023-08-22 ENCOUNTER — Ambulatory Visit (INDEPENDENT_AMBULATORY_CARE_PROVIDER_SITE_OTHER): Payer: Medicare HMO | Admitting: *Deleted

## 2023-08-22 DIAGNOSIS — T63441D Toxic effect of venom of bees, accidental (unintentional), subsequent encounter: Secondary | ICD-10-CM

## 2023-10-17 ENCOUNTER — Ambulatory Visit (INDEPENDENT_AMBULATORY_CARE_PROVIDER_SITE_OTHER): Payer: Medicare HMO

## 2023-10-17 DIAGNOSIS — T63441D Toxic effect of venom of bees, accidental (unintentional), subsequent encounter: Secondary | ICD-10-CM

## 2023-12-12 ENCOUNTER — Ambulatory Visit (INDEPENDENT_AMBULATORY_CARE_PROVIDER_SITE_OTHER)

## 2023-12-12 DIAGNOSIS — T63441D Toxic effect of venom of bees, accidental (unintentional), subsequent encounter: Secondary | ICD-10-CM | POA: Diagnosis not present

## 2023-12-12 MED ORDER — EPINEPHRINE 0.3 MG/0.3ML IJ SOAJ: 0.3000 mg | INTRAMUSCULAR | 1 refills | Status: AC | PRN

## 2024-02-04 NOTE — Progress Notes (Deleted)
 Follow Up Note  RE: Alisha [name] MRN: [id number] DOB: 1945-12-06 Date of Office Visit: 02/05/2024  Referring provider: Burney Darice CROME, MD Primary care provider: Burney Darice CROME, MD  Chief Complaint: No chief complaint on file.  History of Present Illness: I had the pleasure of seeing Alisha [name] for a follow up visit at the [facility] on 02/05/2024. She is a 78 y.o. female, who is being followed for allergic rhinitis and hymenoptera allergy  on VIT. Her previous allergy  office visit was on 10/05/2021 with Alisha Craze FNP. Today is a regular follow up visit.  Discussed the use of AI scribe software for clinical note transcription with the patient, who gave verbal consent to proceed.  History of Present Illness            ***  Assessment and Plan: Alisha [name] is a 78 y.o. female with: Allergic rhinitis Stop Nasacort for the next 5-7 days due to irritation in right nostril. When you start back using Nasacort use the technique we discussed in the office today. In the right nostril, point the applicator out toward the right ear. In the left nostril, point the applicator out toward the left ear   Toxic effect of venom Recommend continuing venom injections directed towards yellow hornet (mixed vespid) and have access to your epinephrine  auto injector device. I will update Dr. Maurilio on your results.   Please let us  know if this treatment plan is not working well for you. Schedule a follow up appointment in 6 months or sooner if needed   Return in about 6 months (around 04/07/2022), or if symptoms worsen or fail to improve. Assessment and Plan              No follow-ups on file.  No orders of the defined types were placed in this encounter.  Lab Orders  No laboratory test(s) ordered today    Diagnostics: Spirometry:  Tracings reviewed. Her effort: {Blank single:19197::Good reproducible efforts.,It was hard to get consistent efforts  and there is a question as to whether this reflects a maximal maneuver.,Poor effort, data can not be interpreted.} FVC: ***L FEV1: ***L, ***% predicted FEV1/FVC ratio: ***% Interpretation: {Blank single:19197::Spirometry consistent with mild obstructive disease,Spirometry consistent with moderate obstructive disease,Spirometry consistent with severe obstructive disease,Spirometry consistent with possible restrictive disease,Spirometry consistent with mixed obstructive and restrictive disease,Spirometry uninterpretable due to technique,Spirometry consistent with normal pattern,No overt abnormalities noted given today's efforts}.  Please see scanned spirometry results for details.  Skin Testing: {Blank single:19197::Select foods,Environmental allergy  panel,Environmental allergy  panel and select foods,Food allergy  panel,None,Deferred due to recent antihistamines use}. *** Results discussed with patient/family.   Medication List:  Current Outpatient Medications  Medication Sig Dispense Refill   ALOE VERA PO Take 1 tablet by mouth daily.     Ascorbic Acid (VITAMIN C PO) Take 1 tablet by mouth daily.     Coenzyme Q10 (CO Q 10 PO) Take 1 tablet by mouth daily.     COLLAGEN PO Take by mouth daily. Marine Collagen     CRANBERRY PO Take by mouth daily.     EPINEPHrine  0.3 mg/0.3 mL IJ SOAJ injection Inject 0.3 mg into the muscle as needed for anaphylaxis. 2 each 1   FIBER PO Take by mouth daily. Fiber Benefits     GARLIC PO Take by mouth daily.     losartan (COZAAR) 50 MG tablet Take 50 mg by mouth daily.     Misc Natural Products (LEG VEIN & CIRCULATION  PO) Take by mouth daily.     Multiple Vitamin (HEALTHY HAIR/SKIN/NAILS PO) Take by mouth daily.     Multiple Vitamin (MULTIVITAMIN) tablet Take 1 tablet by mouth daily.     Multiple Vitamins-Minerals (EYE SUPPORT PO) Take by mouth daily.     Nutritional Supplements (NUTRITIONAL SUPPLEMENT PO) Take by mouth.  Cholestracare     Nutritional Supplements (NUTRITIONAL SUPPLEMENT PO) Take by mouth. K2 (MK7) supplement     Omega-3 Fatty Acids (OMEGA 3 PO) Take 1 capsule by mouth daily.     OVER THE COUNTER MEDICATION Take 1 capsule by mouth daily. Bone and body factors     OVER THE COUNTER MEDICATION Phytoceramides     Probiotic Product (FLORA-Q PO) Take 1 capsule by mouth daily.     Psyllium (NATURAL FIBER PO) Take by mouth. Fibermucil     sucralfate (CARAFATE) 1 g tablet Take 1 g by mouth 2 (two) times daily.     No current facility-administered medications for this visit.   Allergies: No Known Allergies I reviewed her past medical history, social history, family history, and environmental history and no significant changes have been reported from her previous visit.  Review of Systems  Constitutional:  Negative for appetite change, chills, fever and unexpected weight change.  HENT:  Negative for congestion and rhinorrhea.   Eyes:  Negative for itching.  Respiratory:  Negative for cough, chest tightness, shortness of breath and wheezing.   Cardiovascular:  Negative for chest pain.  Gastrointestinal:  Negative for abdominal pain.  Genitourinary:  Negative for difficulty urinating.  Skin:  Negative for rash.  Neurological:  Negative for headaches.    Objective: There were no vitals taken for this visit. There is no height or weight on file to calculate BMI. Physical Exam Vitals and nursing note reviewed.  Constitutional:      Appearance: Normal appearance. She is well-developed.  HENT:     Head: Normocephalic and atraumatic.     Right Ear: Tympanic membrane and external ear normal.     Left Ear: Tympanic membrane and external ear normal.     Nose: Nose normal.     Mouth/Throat:     Mouth: Mucous membranes are moist.     Pharynx: Oropharynx is clear.  Eyes:     Conjunctiva/sclera: Conjunctivae normal.  Cardiovascular:     Rate and Rhythm: Normal rate and regular rhythm.     Heart  sounds: Normal heart sounds. No murmur heard.    No friction rub. No gallop.  Pulmonary:     Effort: Pulmonary effort is normal.     Breath sounds: Normal breath sounds. No wheezing, rhonchi or rales.  Musculoskeletal:     Cervical back: Neck supple.  Skin:    General: Skin is warm.     Findings: No rash.  Neurological:     Mental Status: She is alert and oriented to person, place, and time.  Psychiatric:        Behavior: Behavior normal.    Previous notes and tests were reviewed. The plan was reviewed with the patient/family, and all questions/concerned were addressed.  It was my pleasure to see Ivon today and participate in her care. Please feel free to contact me with any questions or concerns.  Sincerely,  Orlan Cramp, DO Allergy  & Immunology  [facility]  [place] office: [phone] [facility] office: [phone]

## 2024-02-05 ENCOUNTER — Ambulatory Visit: Admitting: Allergy

## 2024-02-05 ENCOUNTER — Ambulatory Visit

## 2024-02-05 DIAGNOSIS — Z91038 Other insect allergy status: Secondary | ICD-10-CM

## 2024-02-05 DIAGNOSIS — J3089 Other allergic rhinitis: Secondary | ICD-10-CM

## 2024-02-06 ENCOUNTER — Other Ambulatory Visit: Payer: Self-pay

## 2024-02-06 ENCOUNTER — Encounter: Payer: Self-pay | Admitting: Internal Medicine

## 2024-02-06 ENCOUNTER — Ambulatory Visit (INDEPENDENT_AMBULATORY_CARE_PROVIDER_SITE_OTHER): Admitting: Internal Medicine

## 2024-02-06 VITALS — BP 140/80 | HR 78 | Temp 98.0°F | Ht 63.0 in | Wt 138.5 lb

## 2024-02-06 DIAGNOSIS — J3089 Other allergic rhinitis: Secondary | ICD-10-CM

## 2024-02-06 DIAGNOSIS — T63441D Toxic effect of venom of bees, accidental (unintentional), subsequent encounter: Secondary | ICD-10-CM | POA: Diagnosis not present

## 2024-02-06 DIAGNOSIS — T63481D Toxic effect of venom of other arthropod, accidental (unintentional), subsequent encounter: Secondary | ICD-10-CM

## 2024-02-06 NOTE — Patient Instructions (Signed)
 Allergic rhinitis - Use nasal saline rinses before nose sprays such as with Neilmed Sinus Rinse.  Use distilled water.   - Use Nasacort 1-2 sprays each nostril daily as needed. Aim upward and outward.  Can try temporarily stopping this and if symptoms are stable, can discontinue.   Toxic effect of venom Will check tryptase level and if normal, stop venom immunotherapy as 5 years of shots are generally protective.  - for SKIN only reaction, okay to take Benadryl 25mg  capsules every 6 hours as needed - for SKIN + ANY additional symptoms, OR IF concern for LIFE THREATENING reaction = Epipen  Autoinjector EpiPen  0.3 mg. - If using Epinephrine  autoinjector, call 911 or go to the ER.

## 2024-02-06 NOTE — Progress Notes (Signed)
   FOLLOW UP Date of Service/Encounter:  02/06/24   Subjective:  Alisha [name] (DOB: [birth year]) is a 78 y.o. female who [facility] for follow up for allergic rhinitis and hymenoptera allergy .   History obtained from: chart review and patient. Last visit was on 10/05/2021 and at the time, discussed retesting to see if she can get off VIT.  Had positive ID to yellow hornet so was decided to continue VIT to mixed vespids.   Since last visit, reports having a sting 1.5 years ago without any reactions, just took benadryl to be safe.  Has an Epipen , has not had to use it.  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    Allergies are doing well.  Takes supplements and Nasacort 3x/week.  Denies much trouble with congestion, drainage, runny nose.     Past Medical History: Past Medical History:  Diagnosis Date   ALLERGIC RHINITIS    Hymenoptera allergy     Hypertension    Recurrent sinusitis     Objective:  BP (!) 140/72   Pulse 78   Temp 98 F (36.7 C)   Ht 5' 3 (1.6 m)   Wt 138 lb 8 oz (62.8 kg)   SpO2 97%   BMI 24.53 kg/m  Body mass index is 24.53 kg/m. Physical Exam: GEN: alert, well developed HEENT: clear conjunctiva, nose without inferior turbinate hypertrophy, pink nasal mucosa, no rhinorrhea HEART: regular rate and rhythm, no murmur LUNGS: clear to auscultation bilaterally, no coughing, unlabored respiration SKIN: no rashes or lesions    Assessment:   1. Other allergic rhinitis   2. Anaphylaxis due to hymenoptera venom, accidental or unintentional, subsequent encounter     Plan/Recommendations:  Allergic rhinitis - Well controlled  - Use nasal saline rinses before nose sprays such as with Neilmed Sinus Rinse.  Use distilled water.   - Use Nasacort 1-2 sprays each nostril daily as needed. Aim upward and outward.  Can try temporarily stopping this and if symptoms are stable, can discontinue.   Toxic effect of  venom Will check tryptase level and if normal, stop venom immunotherapy as 5 years of shots are generally protective especially for mixed vespids.  On VIT since 2018.   - for SKIN only reaction, okay to take Benadryl 25mg  capsules every 6 hours as needed - for SKIN + ANY additional symptoms, OR IF concern for LIFE THREATENING reaction = Epipen  Autoinjector EpiPen  0.3 mg. - If using Epinephrine  autoinjector, call 911 or go to the ER.   Return in about 1 year (around 02/05/2025).  Arleta Blanch, MD [facility]

## 2024-02-08 LAB — TRYPTASE: Tryptase: 2.8 ug/L (ref 2.2–13.2)

## 2024-02-09 ENCOUNTER — Ambulatory Visit: Payer: Self-pay | Admitting: Internal Medicine

## 2024-04-02 ENCOUNTER — Ambulatory Visit
